# Patient Record
Sex: Male | Born: 1980 | Race: White | Hispanic: No | Marital: Single | Smoking: Former smoker
Health system: Southern US, Community
[De-identification: ages and names within clinical notes are randomized; demographics above are authoritative.]

## PROBLEM LIST (undated history)

## (undated) DIAGNOSIS — A498 Other bacterial infections of unspecified site: Secondary | ICD-10-CM

## (undated) DIAGNOSIS — K259 Gastric ulcer, unspecified as acute or chronic, without hemorrhage or perforation: Secondary | ICD-10-CM

## (undated) DIAGNOSIS — I10 Essential (primary) hypertension: Secondary | ICD-10-CM

## (undated) DIAGNOSIS — R918 Other nonspecific abnormal finding of lung field: Secondary | ICD-10-CM

## (undated) DIAGNOSIS — E279 Disorder of adrenal gland, unspecified: Secondary | ICD-10-CM

## (undated) DIAGNOSIS — I82419 Acute embolism and thrombosis of unspecified femoral vein: Secondary | ICD-10-CM

## (undated) DIAGNOSIS — K449 Diaphragmatic hernia without obstruction or gangrene: Secondary | ICD-10-CM

## (undated) HISTORY — PX: APPENDECTOMY: SHX54

## (undated) HISTORY — PX: TONSILLECTOMY: SUR1361

## (undated) HISTORY — PX: NASAL SEPTUM SURGERY: SHX37

---

## 2016-03-12 DIAGNOSIS — A498 Other bacterial infections of unspecified site: Secondary | ICD-10-CM

## 2016-03-12 HISTORY — DX: Other bacterial infections of unspecified site: A49.8

## 2016-03-24 ENCOUNTER — Emergency Department (HOSPITAL_BASED_OUTPATIENT_CLINIC_OR_DEPARTMENT_OTHER): Payer: BLUE CROSS/BLUE SHIELD

## 2016-03-24 ENCOUNTER — Emergency Department (HOSPITAL_BASED_OUTPATIENT_CLINIC_OR_DEPARTMENT_OTHER)
Admission: EM | Admit: 2016-03-24 | Discharge: 2016-03-24 | Disposition: A | Discharge status: Home / Self Care | Payer: BLUE CROSS/BLUE SHIELD | Attending: Emergency Medicine | Admitting: Emergency Medicine

## 2016-03-24 ENCOUNTER — Encounter (HOSPITAL_BASED_OUTPATIENT_CLINIC_OR_DEPARTMENT_OTHER): Payer: Self-pay | Admitting: *Deleted

## 2016-03-24 DIAGNOSIS — M79604 Pain in right leg: Secondary | ICD-10-CM | POA: Diagnosis present

## 2016-03-24 DIAGNOSIS — M5416 Radiculopathy, lumbar region: Secondary | ICD-10-CM | POA: Diagnosis not present

## 2016-03-24 DIAGNOSIS — Z87891 Personal history of nicotine dependence: Secondary | ICD-10-CM | POA: Insufficient documentation

## 2016-03-24 DIAGNOSIS — K4090 Unilateral inguinal hernia, without obstruction or gangrene, not specified as recurrent: Secondary | ICD-10-CM

## 2016-03-24 DIAGNOSIS — Z79899 Other long term (current) drug therapy: Secondary | ICD-10-CM | POA: Insufficient documentation

## 2016-03-24 DIAGNOSIS — I10 Essential (primary) hypertension: Secondary | ICD-10-CM | POA: Diagnosis not present

## 2016-03-24 HISTORY — DX: Gastric ulcer, unspecified as acute or chronic, without hemorrhage or perforation: K25.9

## 2016-03-24 HISTORY — DX: Essential (primary) hypertension: I10

## 2016-03-24 HISTORY — DX: Other bacterial infections of unspecified site: A49.8

## 2016-03-24 HISTORY — DX: Other nonspecific abnormal finding of lung field: R91.8

## 2016-03-24 HISTORY — DX: Diaphragmatic hernia without obstruction or gangrene: K44.9

## 2016-03-24 HISTORY — DX: Disorder of adrenal gland, unspecified: E27.9

## 2016-03-24 HISTORY — DX: Acute embolism and thrombosis of unspecified femoral vein: I82.419

## 2016-03-24 MED ORDER — PREDNISONE 10 MG (21) PO TBPK: 10.0000 mg | ORAL_TABLET | Freq: Every day | ORAL | 0 refills | Status: DC

## 2016-03-24 MED ORDER — DEXAMETHASONE SODIUM PHOSPHATE 10 MG/ML IJ SOLN
10.0000 mg | Freq: Once | INTRAMUSCULAR | Status: AC
  Administered 2016-03-24: 10 mg via INTRAMUSCULAR
  Filled 2016-03-24: qty 1

## 2016-03-24 NOTE — ED Triage Notes (Addendum)
Patient states he has pain and swelling in his right groin for the last two days, after working on his feet for approximately 20 hours.  States the swelling has resolved, but the pain continues.  States he developed right leg cramps last night and has some swelling behind his right knee.  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  Numbness and tingling started this morning .

## 2016-03-24 NOTE — ED Provider Notes (Signed)
[facility] [facility] Provider Note   CSN: [id number] Arrival date & time: 03/24/16  1216  First Provider Contact:  None       History   Chief Complaint Chief Complaint  Patient presents with  . Leg Pain    HPI Benjamin Blackburn is a 35 y.o. male.  Pt has a hx of DVT and said that he has pain in his right leg c/w prior DVT pain.  Pt said that he was on lovenox and coumadin a few years ago for a DVT, then was put on just ASA.  Pt said that he was taken off ASA due to gastric ulcers.  Pt said that many people in his family have DVTs, but he has never been told that he has a "blood clotting disorder."  Pt denies cp or sob.      Past Medical History:  Diagnosis Date  . Adrenal abnormality (HCC)    mass on adrenal gland  . 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 E coli infection 03/2016   completed treatment  . Hiatal hernia   . Hypertension   . Lung mass   . Multiple gastric ulcers     There are no active problems to display for this patient.   Past Surgical History:  Procedure Laterality Date  . APPENDECTOMY    . NASAL SEPTUM SURGERY    . TONSILLECTOMY         Home Medications    Prior to Admission medications   Medication Sig Start Date End Date Taking? Authorizing Provider  BuPROPion HCl (WELLBUTRIN PO) Take by mouth.   Yes Historical Provider, MD  fenofibrate 54 MG tablet Take 54 mg by mouth daily.   Yes Historical Provider, MD  lisinopril-hydrochlorothiazide (PRINZIDE,ZESTORETIC) 10-12.5 MG tablet Take 1 tablet by mouth daily.   Yes Historical Provider, MD  predniSONE (STERAPRED UNI-PAK 21 TAB) 10 MG (21) TBPK tablet Take 1 tablet (10 mg total) by mouth daily. Take 6 tabs by mouth daily  for 2 days, then 5 tabs for 2 days, then 4 tabs for 2 days, then 3 tabs for 2 days, 2 tabs for 2 days, then 1 tab by mouth daily for 2 days 03/24/16   Isla Pence, MD    Family History No family history on file.  Social History Social History  Substance Use  Topics  . Smoking status: Former Research scientist (life sciences)  . Smokeless tobacco: Never Used  . Alcohol use No     Allergies   Amoxicillin; Penicillins; and Tetracyclines & related   Review of Systems Review of Systems  Respiratory: Negative for shortness of breath.   Cardiovascular: Negative for chest pain.  Musculoskeletal:       Right leg pain  All other systems reviewed and are negative.    Physical Exam Updated Vital Signs BP 157/97 (BP Location: Left Arm)   Pulse 82   Temp 97.7 F (36.5 C) (Oral)   Resp 14   Ht 6\' 2"  (1.88 m)   Wt 266 lb (120.7 kg)   SpO2 100%   BMI 34.15 kg/m   Physical Exam  Constitutional: He is oriented to person, place, and time. He appears well-developed and well-nourished.  HENT:  Head: Normocephalic and atraumatic.  Right Ear: External ear normal.  Left Ear: External ear normal.  Nose: Nose normal.  Mouth/Throat: Oropharynx is clear and moist.  Eyes: EOM are normal. Pupils are equal, round, and reactive to light.  Neck: Normal range of  motion. Neck supple.  Cardiovascular: Normal rate, regular rhythm, normal heart sounds and intact distal pulses.   Pulmonary/Chest: Effort normal and breath sounds normal.  Abdominal: Soft. Bowel sounds are normal.  Genitourinary:     Musculoskeletal:  Right leg swelling and tenderness  Neurological: He is alert and oriented to person, place, and time.  Skin: Skin is warm and dry.  Psychiatric: He has a normal mood and affect. His behavior is normal. Judgment and thought content normal.  Nursing note and vitals reviewed.    ED Treatments / Results  Labs (all labs ordered are listed, but only abnormal results are displayed) Labs Reviewed - No data to display  EKG  EKG Interpretation None       Radiology US Venous Img Lower Unilateral Right  Result Date: 03/24/2016 CLINICAL DATA:  RIGHT groin pain radiating to posterior calf for 2 days, intermittent swelling per patient, history of DVT RIGHT leg 2  years ago, known venous insufficiency with venous stasis at RIGHT ankle EXAM: RIGHT LOWER EXTREMITY VENOUS DOPPLER ULTRASOUND TECHNIQUE: Gray-scale sonography with graded compression, as well as color Doppler and duplex ultrasound were performed to evaluate the lower extremity deep venous systems from the level of the common femoral vein and including the common femoral, femoral, profunda femoral, popliteal and calf veins including the posterior tibial, peroneal and gastrocnemius veins when visible. The superficial great saphenous vein was also interrogated. Spectral Doppler was utilized to evaluate flow at rest and with distal augmentation maneuvers in the common femoral, femoral and popliteal veins. COMPARISON:  None. FINDINGS: Contralateral Common Femoral Vein: Respiratory phasicity is normal and symmetric with the symptomatic side. No evidence of thrombus. Normal compressibility. Common Femoral Vein: No evidence of thrombus. Normal compressibility, respiratory phasicity and response to augmentation. Saphenofemoral Junction: No evidence of thrombus. Normal compressibility and flow on color Doppler imaging. Profunda Femoral Vein: No evidence of thrombus. Normal compressibility and flow on color Doppler imaging. Femoral Vein: No evidence of thrombus. Normal compressibility, respiratory phasicity and response to augmentation. Popliteal Vein: No evidence of thrombus. Normal compressibility, respiratory phasicity and response to augmentation. Calf Veins: No evidence of thrombus. Normal compressibility and flow on color Doppler imaging. Superficial Great Saphenous Vein: No evidence of thrombus. Normal compressibility and flow on color Doppler imaging. Venous Reflux:  None. Other Findings:  None. IMPRESSION: No evidence of deep venous thrombosis in RIGHT lower extremity. Electronically Signed   By: Lavonia Dana M.D.   On: 03/24/2016 13:43    Procedures Procedures (including critical care time)  Medications Ordered  in ED Medications  dexamethasone (DECADRON) injection 10 mg (not administered)     Initial Impression / Assessment and Plan / ED Course  I have reviewed the triage vital signs and the nursing notes.  Pertinent labs & imaging results that were available during my care of the patient were reviewed by me and considered in my medical decision making (see chart for details).  Clinical Course   Pt describes pain going down his right leg.  As his [place] is negative for dvt, I will treat pt for radiculopathy.  He is given the number to ortho (for radicular pain) and to surgery (for hernia).  He knows to return if worse.   Final Clinical Impressions(s) / ED Diagnoses   Final diagnoses:  Lumbar radiculopathy, acute  Unilateral inguinal hernia without obstruction or gangrene, recurrence not specified    New Prescriptions New Prescriptions   PREDNISONE (STERAPRED UNI-PAK 21 TAB) 10 MG (21) TBPK TABLET  Take 1 tablet (10 mg total) by mouth daily. Take 6 tabs by mouth daily  for 2 days, then 5 tabs for 2 days, then 4 tabs for 2 days, then 3 tabs for 2 days, 2 tabs for 2 days, then 1 tab by mouth daily for 2 days     Isla Pence, MD 03/24/16 1409

## 2016-04-01 ENCOUNTER — Ambulatory Visit: Payer: BLUE CROSS/BLUE SHIELD | Admitting: Family Medicine

## 2016-05-02 ENCOUNTER — Emergency Department (HOSPITAL_BASED_OUTPATIENT_CLINIC_OR_DEPARTMENT_OTHER)
Admission: EM | Admit: 2016-05-02 | Discharge: 2016-05-02 | Disposition: A | Discharge status: Home / Self Care | Payer: BLUE CROSS/BLUE SHIELD | Attending: Emergency Medicine | Admitting: Emergency Medicine

## 2016-05-02 ENCOUNTER — Encounter (HOSPITAL_BASED_OUTPATIENT_CLINIC_OR_DEPARTMENT_OTHER): Payer: Self-pay | Admitting: Emergency Medicine

## 2016-05-02 DIAGNOSIS — R197 Diarrhea, unspecified: Secondary | ICD-10-CM | POA: Insufficient documentation

## 2016-05-02 DIAGNOSIS — I10 Essential (primary) hypertension: Secondary | ICD-10-CM | POA: Diagnosis not present

## 2016-05-02 DIAGNOSIS — J029 Acute pharyngitis, unspecified: Secondary | ICD-10-CM | POA: Diagnosis not present

## 2016-05-02 DIAGNOSIS — Z87891 Personal history of nicotine dependence: Secondary | ICD-10-CM | POA: Diagnosis not present

## 2016-05-02 DIAGNOSIS — L03211 Cellulitis of face: Secondary | ICD-10-CM | POA: Diagnosis not present

## 2016-05-02 DIAGNOSIS — R52 Pain, unspecified: Secondary | ICD-10-CM | POA: Diagnosis present

## 2016-05-02 DIAGNOSIS — R5383 Other fatigue: Secondary | ICD-10-CM | POA: Diagnosis not present

## 2016-05-02 DIAGNOSIS — J340 Abscess, furuncle and carbuncle of nose: Secondary | ICD-10-CM

## 2016-05-02 DIAGNOSIS — Z79899 Other long term (current) drug therapy: Secondary | ICD-10-CM | POA: Diagnosis not present

## 2016-05-02 LAB — COMPREHENSIVE METABOLIC PANEL
ALT: 66 U/L — AB (ref 17–63)
AST: 32 U/L (ref 15–41)
Albumin: 4.3 g/dL (ref 3.5–5.0)
Alkaline Phosphatase: 52 U/L (ref 38–126)
Anion gap: 6 (ref 5–15)
BILIRUBIN TOTAL: 0.5 mg/dL (ref 0.3–1.2)
BUN: 12 mg/dL (ref 6–20)
CALCIUM: 9.1 mg/dL (ref 8.9–10.3)
CHLORIDE: 105 mmol/L (ref 101–111)
CO2: 25 mmol/L (ref 22–32)
CREATININE: 1 mg/dL (ref 0.61–1.24)
Glucose, Bld: 91 mg/dL (ref 65–99)
Potassium: 4 mmol/L (ref 3.5–5.1)
Sodium: 136 mmol/L (ref 135–145)
TOTAL PROTEIN: 7.2 g/dL (ref 6.5–8.1)

## 2016-05-02 LAB — CBC WITH DIFFERENTIAL/PLATELET
BASOS ABS: 0 10*3/uL (ref 0.0–0.1)
BASOS PCT: 0 %
EOS ABS: 0.1 10*3/uL (ref 0.0–0.7)
EOS PCT: 1 %
HCT: 42.9 % (ref 39.0–52.0)
HEMOGLOBIN: 15.2 g/dL (ref 13.0–17.0)
LYMPHS ABS: 2.5 10*3/uL (ref 0.7–4.0)
Lymphocytes Relative: 27 %
MCH: 30.4 pg (ref 26.0–34.0)
MCHC: 35.4 g/dL (ref 30.0–36.0)
MCV: 85.8 fL (ref 78.0–100.0)
Monocytes Absolute: 0.7 10*3/uL (ref 0.1–1.0)
Monocytes Relative: 8 %
NEUTROS PCT: 64 %
Neutro Abs: 5.9 10*3/uL (ref 1.7–7.7)
PLATELETS: 243 10*3/uL (ref 150–400)
RBC: 5 MIL/uL (ref 4.22–5.81)
RDW: 12.3 % (ref 11.5–15.5)
WBC: 9.4 10*3/uL (ref 4.0–10.5)

## 2016-05-02 LAB — URINALYSIS, ROUTINE W REFLEX MICROSCOPIC
Bilirubin Urine: NEGATIVE
GLUCOSE, UA: NEGATIVE mg/dL
HGB URINE DIPSTICK: NEGATIVE
Ketones, ur: NEGATIVE mg/dL
LEUKOCYTES UA: NEGATIVE
Nitrite: NEGATIVE
PROTEIN: NEGATIVE mg/dL
SPECIFIC GRAVITY, URINE: 1.018 (ref 1.005–1.030)
pH: 7.5 (ref 5.0–8.0)

## 2016-05-02 MED ORDER — SODIUM CHLORIDE 0.9 % IV BOLUS (SEPSIS)
1000.0000 mL | Freq: Once | INTRAVENOUS | Status: AC
  Administered 2016-05-02: 1000 mL via INTRAVENOUS

## 2016-05-02 MED ORDER — SULFAMETHOXAZOLE-TRIMETHOPRIM 800-160 MG PO TABS: 1.0000 | ORAL_TABLET | Freq: Two times a day (BID) | ORAL | 0 refills | Status: DC

## 2016-05-02 NOTE — ED Provider Notes (Signed)
[place] [facility] Provider Note   CSN: [id number] Arrival date & time: 05/02/16  1714  By signing my name below, I, Emmanuella Mensah, attest that this documentation has been prepared under the direction and in the presence of Davonna Belling, MD. Electronically Signed: Judithann Sauger, ED Scribe. 05/02/16. 6:17 PM.    History   Chief Complaint Chief Complaint  Patient presents with  . Generalized Body Aches    HPI Comments: Benjamin Blackburn is a 35 y.o. male with a hx of DVT of right deep femoral vein, E. Coli infection, and hypertension who presents to the Emergency Department complaining of gradually worsening multiple moderately painful flat areas of redness to his RLE onset several weeks ago. He notes that the areas of redness began to spread to his right nostril and right axilla region within the last few days. He reports associated chills, generalized body aches, mild sore throat, and RLE numbness. No alleviating factors noted. Pt has not tried any medications PTA. He reports that he was diagnosed with E. Coli from a stool sample 4 weeks ago and has been treated with Cipro. He states that the Cipro has not been helpful with his diarrheal symptoms. He adds that he has been having persistent moderate episodes of non-bloody, dark watery diarrhea onset one year ago. He denies any recent travel outside the country. He also denies any fever, unexpected weight changes, cough, shortness of breath, headache, dizziness, lightheadedness, trouble swallowing, or any lip/tongue swelling.   The history is provided by the patient. No language interpreter was used.    Past Medical History:  Diagnosis Date  . Adrenal abnormality (HCC)    mass on adrenal gland  . 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 E coli infection 03/2016   completed treatment  . Hiatal hernia   . Hypertension   . Lung mass   . Multiple gastric ulcers     There are no active problems to display for this  patient.   Past Surgical History:  Procedure Laterality Date  . APPENDECTOMY    . NASAL SEPTUM SURGERY    . TONSILLECTOMY         Home Medications    Prior to Admission medications   Medication Sig Start Date End Date Taking? Authorizing Provider  BuPROPion HCl (WELLBUTRIN PO) Take by mouth.    Historical Provider, MD  fenofibrate 54 MG tablet Take 54 mg by mouth daily.    Historical Provider, MD  lisinopril-hydrochlorothiazide (PRINZIDE,ZESTORETIC) 10-12.5 MG tablet Take 1 tablet by mouth daily.    Historical Provider, MD  predniSONE (STERAPRED UNI-PAK 21 TAB) 10 MG (21) TBPK tablet Take 1 tablet (10 mg total) by mouth daily. Take 6 tabs by mouth daily  for 2 days, then 5 tabs for 2 days, then 4 tabs for 2 days, then 3 tabs for 2 days, 2 tabs for 2 days, then 1 tab by mouth daily for 2 days 03/24/16   Isla Pence, MD  sulfamethoxazole-trimethoprim (BACTRIM DS,SEPTRA DS) 800-160 MG tablet Take 1 tablet by mouth 2 (two) times daily. 05/02/16 05/09/16  Davonna Belling, MD    Family History History reviewed. No pertinent family history.  Social History Social History  Substance Use Topics  . Smoking status: Former Research scientist (life sciences)  . Smokeless tobacco: Never Used  . Alcohol use No     Allergies   Amoxicillin; Penicillins; and Tetracyclines & related   Review of Systems Review of Systems  Constitutional: Positive for chills and  fatigue. Negative for fever and unexpected weight change.  HENT: Positive for sore throat. Negative for facial swelling and trouble swallowing.   Respiratory: Negative for cough and shortness of breath.   Skin: Positive for rash.  Neurological: Positive for numbness. Negative for dizziness, light-headedness and headaches.  All other systems reviewed and are negative.    Physical Exam Updated Vital Signs BP 117/65 (BP Location: Right Arm)   Pulse 60   Temp 97.6 F (36.4 C) (Oral)   Resp 16   Ht 6\' 2"  (1.88 m)   Wt 264 lb (119.7 kg)   SpO2 97%    BMI 33.90 kg/m   Physical Exam  Constitutional: He is oriented to person, place, and time. He appears well-developed and well-nourished. No distress.  HENT:  Head: Normocephalic and atraumatic.  Tenderness to inside of right lateral nostril where there is a 3 cm pustule on the outside of it. no drainge, no swelling of face  Eyes: Conjunctivae and EOM are normal.  Neck: Neck supple. No tracheal deviation present.  Cardiovascular: Normal rate, regular rhythm and normal heart sounds.   Pulmonary/Chest: Effort normal. No respiratory distress. He has no wheezes.  Abdominal: Soft. Bowel sounds are normal. There is no tenderness.  Musculoskeletal: Normal range of motion. He exhibits edema.  Trace BLE pitting edema  Neurological: He is alert and oriented to person, place, and time.  Skin: Skin is warm and dry.  Few small 3 cm areas of raised erythematous bumps on trunks and extremities, none on palms or nailbeds.   Psychiatric: He has a normal mood and affect. His behavior is normal.  Nursing note and vitals reviewed.    ED Treatments / Results  DIAGNOSTIC STUDIES: Oxygen Saturation is 98% on RA, normal by my interpretation.    COORDINATION OF CARE: 6:13 PM- Pt advised of plan for treatment and pt agrees. He will receive lab work for further evaluation. He will also receive IV fluids.     Labs (all labs ordered are listed, but only abnormal results are displayed) Labs Reviewed  COMPREHENSIVE METABOLIC PANEL - Abnormal; Notable for the following:       Result Value   ALT 66 (*)    All other components within normal limits  CULTURE, BLOOD (ROUTINE X 2)  CULTURE, BLOOD (ROUTINE X 2)  CBC WITH DIFFERENTIAL/PLATELET  URINALYSIS, ROUTINE W REFLEX MICROSCOPIC (NOT AT [facility])    EKG  EKG Interpretation None       Radiology No results found.  Procedures Procedures (including critical care time)  Medications Ordered in ED Medications  sodium chloride 0.9 % bolus 1,000 mL (1,000  mLs Intravenous New Bag/Given 05/02/16 1858)     Initial Impression / Assessment and Plan / ED Course  Davonna Belling, MD has reviewed the triage vital signs and the nursing notes.  Pertinent labs & imaging results that were available during my care of the patient were reviewed by me and considered in my medical decision making (see chart for details).  Clinical Course    Patient presents with generalized weakness. Has a rash. Sore throat. Has had chills. Reportedly had Escherichia coli in stool. Had been on Cipro. Does have small abscess in his nose with some drainage. Labs reassuring. Feels somewhat better after IV fluids. Blood cultures sent since he had reported Escherichia coli infection and has been feeling bad systemically with a rash. Labs reassuring and patient will be discharged home with antibiotics to cover the cellulitis surrounding the abscess.  Final Clinical  Impressions(s) / ED Diagnoses   Final diagnoses:  Cellulitis of nose  Diarrhea, unspecified type    New Prescriptions New Prescriptions   SULFAMETHOXAZOLE-TRIMETHOPRIM (BACTRIM DS,SEPTRA DS) 800-160 MG TABLET    Take 1 tablet by mouth 2 (two) times daily.   I personally performed the services described in this documentation, which was scribed in my presence. The recorded information has been reviewed and is accurate.       Davonna Belling, MD 05/02/16 2033

## 2016-05-02 NOTE — ED Triage Notes (Signed)
Patient states that he was dx with E coli about 2 weeks ago and did a round of cipro. He has a "spot" and "bumps" to his nose and axilla region. He reports that he just in general "bad"

## 2016-05-07 LAB — CULTURE, BLOOD (ROUTINE X 2)
CULTURE: NO GROWTH
Culture: NO GROWTH

## 2016-05-08 ENCOUNTER — Emergency Department (HOSPITAL_COMMUNITY): Payer: BLUE CROSS/BLUE SHIELD

## 2016-05-08 ENCOUNTER — Observation Stay (HOSPITAL_COMMUNITY)
Admission: EM | Admit: 2016-05-08 | Discharge: 2016-05-10 | Disposition: A | Discharge status: Home / Self Care | Payer: BLUE CROSS/BLUE SHIELD | Attending: Family Medicine | Admitting: Family Medicine

## 2016-05-08 ENCOUNTER — Encounter (HOSPITAL_COMMUNITY): Payer: Self-pay

## 2016-05-08 DIAGNOSIS — Z86718 Personal history of other venous thrombosis and embolism: Secondary | ICD-10-CM | POA: Insufficient documentation

## 2016-05-08 DIAGNOSIS — E278 Other specified disorders of adrenal gland: Secondary | ICD-10-CM | POA: Insufficient documentation

## 2016-05-08 DIAGNOSIS — R1013 Epigastric pain: Secondary | ICD-10-CM | POA: Diagnosis not present

## 2016-05-08 DIAGNOSIS — E785 Hyperlipidemia, unspecified: Secondary | ICD-10-CM | POA: Diagnosis not present

## 2016-05-08 DIAGNOSIS — Z87891 Personal history of nicotine dependence: Secondary | ICD-10-CM | POA: Diagnosis not present

## 2016-05-08 DIAGNOSIS — Z881 Allergy status to other antibiotic agents status: Secondary | ICD-10-CM | POA: Diagnosis not present

## 2016-05-08 DIAGNOSIS — Z6834 Body mass index (BMI) 34.0-34.9, adult: Secondary | ICD-10-CM | POA: Insufficient documentation

## 2016-05-08 DIAGNOSIS — R51 Headache: Secondary | ICD-10-CM | POA: Diagnosis not present

## 2016-05-08 DIAGNOSIS — K449 Diaphragmatic hernia without obstruction or gangrene: Secondary | ICD-10-CM | POA: Insufficient documentation

## 2016-05-08 DIAGNOSIS — F329 Major depressive disorder, single episode, unspecified: Secondary | ICD-10-CM | POA: Insufficient documentation

## 2016-05-08 DIAGNOSIS — I2510 Atherosclerotic heart disease of native coronary artery without angina pectoris: Secondary | ICD-10-CM | POA: Diagnosis not present

## 2016-05-08 DIAGNOSIS — K760 Fatty (change of) liver, not elsewhere classified: Secondary | ICD-10-CM | POA: Insufficient documentation

## 2016-05-08 DIAGNOSIS — G2581 Restless legs syndrome: Secondary | ICD-10-CM | POA: Insufficient documentation

## 2016-05-08 DIAGNOSIS — Z9119 Patient's noncompliance with other medical treatment and regimen: Secondary | ICD-10-CM | POA: Insufficient documentation

## 2016-05-08 DIAGNOSIS — R079 Chest pain, unspecified: Principal | ICD-10-CM | POA: Insufficient documentation

## 2016-05-08 DIAGNOSIS — J340 Abscess, furuncle and carbuncle of nose: Secondary | ICD-10-CM | POA: Insufficient documentation

## 2016-05-08 DIAGNOSIS — M7989 Other specified soft tissue disorders: Secondary | ICD-10-CM

## 2016-05-08 DIAGNOSIS — G4733 Obstructive sleep apnea (adult) (pediatric): Secondary | ICD-10-CM | POA: Insufficient documentation

## 2016-05-08 DIAGNOSIS — Z880 Allergy status to penicillin: Secondary | ICD-10-CM | POA: Insufficient documentation

## 2016-05-08 DIAGNOSIS — Z8249 Family history of ischemic heart disease and other diseases of the circulatory system: Secondary | ICD-10-CM | POA: Insufficient documentation

## 2016-05-08 DIAGNOSIS — R5382 Chronic fatigue, unspecified: Secondary | ICD-10-CM | POA: Insufficient documentation

## 2016-05-08 DIAGNOSIS — I10 Essential (primary) hypertension: Secondary | ICD-10-CM | POA: Diagnosis not present

## 2016-05-08 DIAGNOSIS — E669 Obesity, unspecified: Secondary | ICD-10-CM | POA: Insufficient documentation

## 2016-05-08 DIAGNOSIS — K2980 Duodenitis without bleeding: Secondary | ICD-10-CM | POA: Insufficient documentation

## 2016-05-08 DIAGNOSIS — Z9049 Acquired absence of other specified parts of digestive tract: Secondary | ICD-10-CM | POA: Insufficient documentation

## 2016-05-08 DIAGNOSIS — G8929 Other chronic pain: Secondary | ICD-10-CM | POA: Insufficient documentation

## 2016-05-08 DIAGNOSIS — Z91048 Other nonmedicinal substance allergy status: Secondary | ICD-10-CM | POA: Insufficient documentation

## 2016-05-08 DIAGNOSIS — D3500 Benign neoplasm of unspecified adrenal gland: Secondary | ICD-10-CM | POA: Insufficient documentation

## 2016-05-08 DIAGNOSIS — Z8711 Personal history of peptic ulcer disease: Secondary | ICD-10-CM | POA: Insufficient documentation

## 2016-05-08 DIAGNOSIS — Z91040 Latex allergy status: Secondary | ICD-10-CM | POA: Diagnosis not present

## 2016-05-08 DIAGNOSIS — F419 Anxiety disorder, unspecified: Secondary | ICD-10-CM | POA: Insufficient documentation

## 2016-05-08 LAB — I-STAT TROPONIN, ED
TROPONIN I, POC: 0.01 ng/mL (ref 0.00–0.08)
Troponin i, poc: 0 ng/mL (ref 0.00–0.08)

## 2016-05-08 LAB — CBC
HEMATOCRIT: 49.8 % (ref 39.0–52.0)
HEMATOCRIT: 45.3 % (ref 39.0–52.0)
HEMOGLOBIN: 16.7 g/dL (ref 13.0–17.0)
Hemoglobin: 15.7 g/dL (ref 13.0–17.0)
MCH: 29.9 pg (ref 26.0–34.0)
MCH: 30.3 pg (ref 26.0–34.0)
MCHC: 33.5 g/dL (ref 30.0–36.0)
MCHC: 34.7 g/dL (ref 30.0–36.0)
MCV: 89.1 fL (ref 78.0–100.0)
MCV: 87.5 fL (ref 78.0–100.0)
Platelets: 243 10*3/uL (ref 150–400)
Platelets: 229 10*3/uL (ref 150–400)
RBC: 5.59 MIL/uL (ref 4.22–5.81)
RBC: 5.18 MIL/uL (ref 4.22–5.81)
RDW: 12.3 % (ref 11.5–15.5)
RDW: 12.3 % (ref 11.5–15.5)
WBC: 9.2 10*3/uL (ref 4.0–10.5)
WBC: 7.9 10*3/uL (ref 4.0–10.5)

## 2016-05-08 LAB — BASIC METABOLIC PANEL
ANION GAP: 11 (ref 5–15)
BUN: 11 mg/dL (ref 6–20)
CHLORIDE: 104 mmol/L (ref 101–111)
CO2: 22 mmol/L (ref 22–32)
Calcium: 9.6 mg/dL (ref 8.9–10.3)
Creatinine, Ser: 1.15 mg/dL (ref 0.61–1.24)
Glucose, Bld: 122 mg/dL — ABNORMAL HIGH (ref 65–99)
POTASSIUM: 4.2 mmol/L (ref 3.5–5.1)
SODIUM: 137 mmol/L (ref 135–145)

## 2016-05-08 LAB — TROPONIN I

## 2016-05-08 LAB — CREATININE, SERUM
CREATININE: 1.03 mg/dL (ref 0.61–1.24)
GFR calc Af Amer: >60 mL/min (ref >60)

## 2016-05-08 MED ORDER — HYDROCHLOROTHIAZIDE 12.5 MG PO CAPS
12.5000 mg | ORAL_CAPSULE | Freq: Every day | ORAL | Status: DC
  Administered 2016-05-09: 12.5 mg via ORAL
  Filled 2016-05-08: qty 1

## 2016-05-08 MED ORDER — PANTOPRAZOLE SODIUM 40 MG PO TBEC
40.0000 mg | DELAYED_RELEASE_TABLET | Freq: Every day | ORAL | Status: DC
  Administered 2016-05-08 – 2016-05-10 (×3): 40 mg via ORAL
  Filled 2016-05-08 (×3): qty 1

## 2016-05-08 MED ORDER — LISINOPRIL-HYDROCHLOROTHIAZIDE 10-12.5 MG PO TABS: 1.0000 | ORAL_TABLET | Freq: Every day | ORAL | Status: DC

## 2016-05-08 MED ORDER — SULFAMETHOXAZOLE-TRIMETHOPRIM 800-160 MG PO TABS
1.0000 | ORAL_TABLET | Freq: Two times a day (BID) | ORAL | Status: AC
  Administered 2016-05-08 – 2016-05-09 (×3): 1 via ORAL
  Filled 2016-05-08 (×3): qty 1

## 2016-05-08 MED ORDER — ENOXAPARIN SODIUM 40 MG/0.4ML ~~LOC~~ SOLN
40.0000 mg | SUBCUTANEOUS | Status: DC
  Administered 2016-05-08 – 2016-05-09 (×2): 40 mg via SUBCUTANEOUS
  Filled 2016-05-08 (×2): qty 0.4

## 2016-05-08 MED ORDER — ROPINIROLE HCL 1 MG PO TABS
1.0000 mg | ORAL_TABLET | Freq: Every day | ORAL | Status: DC
  Administered 2016-05-08 – 2016-05-09 (×2): 1 mg via ORAL
  Filled 2016-05-08 (×2): qty 1

## 2016-05-08 MED ORDER — ONDANSETRON HCL 4 MG/2ML IJ SOLN
4.0000 mg | Freq: Four times a day (QID) | INTRAMUSCULAR | Status: DC | PRN
  Administered 2016-05-09: 4 mg via INTRAVENOUS
  Filled 2016-05-08: qty 2

## 2016-05-08 MED ORDER — LISINOPRIL 10 MG PO TABS
10.0000 mg | ORAL_TABLET | Freq: Every day | ORAL | Status: DC
  Administered 2016-05-09 – 2016-05-10 (×2): 10 mg via ORAL
  Filled 2016-05-08 (×2): qty 1

## 2016-05-08 MED ORDER — ACETAMINOPHEN 500 MG PO TABS
1000.0000 mg | ORAL_TABLET | Freq: Four times a day (QID) | ORAL | Status: DC | PRN
  Administered 2016-05-08: 1000 mg via ORAL
  Filled 2016-05-08: qty 2

## 2016-05-08 MED ORDER — ACETAMINOPHEN 325 MG PO TABS
650.0000 mg | ORAL_TABLET | ORAL | Status: DC | PRN
  Administered 2016-05-09 – 2016-05-10 (×2): 650 mg via ORAL
  Filled 2016-05-08 (×2): qty 2

## 2016-05-08 MED ORDER — IOPAMIDOL (ISOVUE-370) INJECTION 76%
INTRAVENOUS | Status: AC
  Administered 2016-05-08: 100 mL
  Filled 2016-05-08: qty 100

## 2016-05-08 MED ORDER — ASPIRIN 81 MG PO CHEW
324.0000 mg | CHEWABLE_TABLET | Freq: Once | ORAL | Status: AC
  Administered 2016-05-08: 324 mg via ORAL
  Filled 2016-05-08: qty 4

## 2016-05-08 MED ORDER — FENOFIBRATE 54 MG PO TABS
54.0000 mg | ORAL_TABLET | Freq: Every day | ORAL | Status: DC
  Administered 2016-05-09 – 2016-05-10 (×2): 54 mg via ORAL
  Filled 2016-05-08 (×2): qty 1

## 2016-05-08 MED ORDER — GI COCKTAIL ~~LOC~~: 30.0000 mL | Freq: Four times a day (QID) | ORAL | Status: DC | PRN

## 2016-05-08 MED ORDER — NITROGLYCERIN 0.4 MG SL SUBL
0.4000 mg | SUBLINGUAL_TABLET | SUBLINGUAL | Status: DC | PRN
  Administered 2016-05-08: 0.4 mg via SUBLINGUAL
  Filled 2016-05-08: qty 1

## 2016-05-08 NOTE — H&P (Signed)
[facility] Admission History and Physical Service Pager: [phone]  Patient name: Benjamin Blackburn Medical record number: [id number] Date of birth: 06/10/1981 Age: 35 y.o. Gender: male  Primary Care Provider: No PCP Per Patient Consultants: None Code Status:  FULL per discussion on admission  Chief Complaint: chest pain  Assessment and Plan: Benjamin Blackburn is a 35 y.o. male presenting with chest pain . 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   Chest pain: HEART score is 3 secondary to nonspecific repolarization on EKG and risk factors. His father had 3 MIs prior to the age of 93 and the patient reportedly has never had a cardiac work up. EKG without ST elevation or depression. Initial troponin negative.  Given h/o unprovoked DVTs, there was concerned for PE, however CTA was negative. All labwork unremarkable. CXR clear. Chest pain not reproducible on exam making MSK etiology less likely.  Also on the DDx is GI related (as pt has a h/o epigastric pain and is tender in the epigastric region) and anxiety given palpitations prior to the onset of pain.  - place in observation, attending Dr. Nori Riis - monitor on telemetry - Risk stratification labs  - trend troponins  - EKG in the AM  - notes Dr. Koleen Nimrod (his endocrinologist) did not want him on ASA previously) - given significant family h/o, would consider getting cardiology c/s in the AM to see if he would benefit from outpatient work up with something like a stress test.   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 Notes he was treated with Lovenox and Coumadin but has been off this treatment for some time. States he's seen a vascular doctor but denies any hypercoagulable work up. Noted to have tenderness and a slight bulging just inferior to the R popliteal fossa.  - Doppler of the RLE - if noted  to have another DVT, would suggest hypercoagulable work up in the future  HLD: currently on fenofibrate - after lipid panel, consider addition of statin per results and ASCVD score  HTN: on lisinopril/HCTZ 10/12.5 mg by mouth once daily. Blood pressure at goal. - continue home regimen.   H/o cellulitis of the nose: not noted on my exam. - continue 3 more doses of Bactrim  Restless leg syndrome:  -continue home Requip   Chronic fatigue: notes orthopnea as well. States he's had a sleep study in the past. Was not diagnosed with apnea but notes he has a CPAP to wear intermittently for fatigue which he doesn't wear? - will hold off on CPAP for now  Depression: was on Wellbutrin but states he self discontinued this as has been doing well. He doesn't feel like he needs this any more  Epigastric pain, chronic: sees GI at [facility].  EGD March 2017 showed: "Mild esophagitis, Irregular Z-line, Small hiatal hernia, Moderate erosive gastritis, Tiny shallow antral ulcer, Mild duodenitis" Biopsies showed inflammation in his stomach and esophagus but were otherwise negative for celiac disease, H. Pylori, or Barrett's esophagus.   Adrenal mass: ~2cm adrenal adenoma on the left.  Sees Dr. Koleen Nimrod with endocrinology at [facility]. Appears stable on CTA here from previous findings.   NAFLD/NASH: Noted during previous hospitalizations. Fatty infiltration of the liver noted on CTA today - will check LFTs in the AM  FEN/GI: heart healthy diet/PPi/ IV saline lock Prophylaxis: Lovenox   Disposition: observe overnight  History of Present Illness:  Benjamin Blackburn is a 35 y.o. male presenting with chest pain.   Today he suddenly started having palpitations that started around 12:45pm while on the elevator at work. He then immediately had centrally located chest pain that radiated up to his L shoulder. The pain was characterized as moderate to severe with "heavy" and tightness."   He had some tingling of his L  UE but he isn't sure if it was secondary to anxiety.  He felt lightheaded and dizzy with some diaphoresis. No SOB.  No vision change. No headaches. Endorses some nausea, but no emesis. The palpitations resolved very quickly but all his other symptoms persisted. He notes slightly worsened LE swelling in the RLE. He also endorses some orthopnea since being in the ED.   He has mild numbness in his R leg that previously had a DVT in it (4 years ago and 2 years ago).  He's never had any chest pain like this before. His baseline epgiastric pain is stable.   In the ED, he received ASA 324mg , Tylenol, and SL NGT.  After getting SL NGT at 1617 his other symptoms resolved.   Review Of Systems: Per HPI with the following additions:  Review of Systems  Constitutional: Positive for diaphoresis and malaise/fatigue. Negative for chills and fever.       Chronic fatigue  HENT: Negative for congestion.   Eyes: Negative for blurred vision, photophobia and discharge.  Respiratory: Positive for shortness of breath. Negative for cough, hemoptysis and sputum production.   Cardiovascular: Positive for chest pain, palpitations, orthopnea and leg swelling. Negative for claudication.  Gastrointestinal: Positive for abdominal pain, heartburn and nausea. Negative for constipation, diarrhea and vomiting.  Genitourinary: Negative for dysuria and flank pain.  Musculoskeletal: Negative for back pain, falls and myalgias.  Skin: Negative for rash.  Neurological: Positive for dizziness and tingling. Negative for tremors, speech change, focal weakness, loss of consciousness and headaches.  Psychiatric/Behavioral: Positive for depression.       Notes depression is stable and he no longer takes Wellbutrin    Past Medical History: Past Medical History:  Diagnosis Date  . Adrenal abnormality (HCC)    mass on adrenal gland  . 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 E coli infection 03/2016   completed treatment  . Hiatal  hernia   . Hypertension   . Lung mass   . Multiple gastric ulcers     Past Surgical History: Past Surgical History:  Procedure Laterality Date  . APPENDECTOMY    . NASAL SEPTUM SURGERY    . TONSILLECTOMY      Social History: Social History  Substance Use Topics  . Smoking status: Former Research scientist (life sciences)  . Smokeless tobacco: Never Used  . Alcohol use No   Additional social history: former smoker (quite within the last year), occasional alcohol use, no drug use.   Please also refer to relevant sections of EMR.  Family History: No family history on file. PVD in father and uncle  Dad- 3 MIs prior to the age of 91.   Allergies and Medications: Allergies  Allergen Reactions  . Amoxicillin Rash  . Latex Rash  . Penicillins Rash    Has patient had a PCN reaction causing immediate rash, facial/tongue/throat swelling, SOB or lightheadedness with hypotension: Yes Has patient had a PCN reaction causing severe rash involving mucus membranes or skin necrosis: No Has patient had a PCN reaction that required hospitalization No Has  patient had a PCN reaction occurring within the last 10 years: No If all of the above answers are "NO", then may proceed with Cephalosporin use.   . Tape Rash  . Tetracyclines & Related Rash   No current facility-administered medications on file prior to encounter.    Current Outpatient Prescriptions on File Prior to Encounter  Medication Sig Dispense Refill  . fenofibrate 54 MG tablet Take 54 mg by mouth daily.    Marland Kitchen sulfamethoxazole-trimethoprim (BACTRIM DS,SEPTRA DS) 800-160 MG tablet Take 1 tablet by mouth 2 (two) times daily. 10 tablet 0  . predniSONE (STERAPRED UNI-PAK 21 TAB) 10 MG (21) TBPK tablet Take 1 tablet (10 mg total) by mouth daily. Take 6 tabs by mouth daily  for 2 days, then 5 tabs for 2 days, then 4 tabs for 2 days, then 3 tabs for 2 days, 2 tabs for 2 days, then 1 tab by mouth daily for 2 days (Patient not taking: Reported on 05/08/2016) 42 tablet  0  lisinopril-HCTZ 10-12.5mg   Requip 1mg  qHS    Objective: BP 113/99   Pulse 66   Temp 97.5 F (36.4 C) (Oral)   Resp 15   SpO2 95%  Exam: General: Lying in bed in NAD. Non-toxic.  Eyes: Conjunctivae non-injected.  ENTM: Moist mucous membranes. Oropharynx clear. No nasal discharge.  Neck: Supple, no LAD. No JVD.  Cardiovascular: RRR. No murmurs, rubs, or gallops noted. Pain no reproducible on exam. Mild edema of the LE b/l, R>L but no pitting edema noted. Respiratory: No increased WOB. CTAB without wheezing, rhonchi, or crackles noted. Abdomen: +BS, soft, non-distended, minimally tender in the epigastric region. No rebound or guarding.  MSK: Normal bulk and tone. Some fullness inferior to the posterior fossa on the right with tenderness of the RLE. Positive Homans on the right.  Skin: No rashes noted over the exposed skin.   Neuro: A&O x4. No gross neurologic deficits  Psych:  Appropriate mood and affect.    Labs and Imaging: CBC BMET   Recent Labs Lab 05/08/16 1335  WBC 9.2  HGB 16.7  HCT 49.8  PLT 243    Recent Labs Lab 05/08/16 1335  NA 137  K 4.2  CL 104  CO2 22  BUN 11  CREATININE 1.15  GLUCOSE 122*  CALCIUM 9.6     Troponin 0.0, 0.01  Dg Chest 2 View  Result Date: 05/08/2016 CLINICAL DATA:  Chest pain and shortness of breath beginning today. EXAM: CHEST  2 VIEW COMPARISON:  CT chest and PA and lateral chest 10/27/2015. FINDINGS: The lungs are clear. Heart size is normal. No pneumothorax or pleural effusion. No focal bony abnormality. IMPRESSION: Negative chest. Electronically Signed   By: Inge Rise M.D.   On: 05/08/2016 14:10   Ct Angio Chest Pe W And/or Wo Contrast  Result Date: 05/08/2016 CLINICAL DATA:  PT WORKS HERE AND WAS GETTING ON ELEVATOR ABOUT 3 HRS AGO WHEN HE HAD A VERY SHARP PAIN IN MID, LEFT CHEST, WITH PALPATIONS AND EXCESSIVE SWEATINGHAD SOMETHING SIMILAR TO THIS ABOUT 3 MONTHS AGO AT [place] BUT WAS TOLD HE HAD A VERY LARGE  HIATAL HERNIA THAT WAS CAUSING THE PROBLEMNOT DIABETIC EXAM: CT ANGIOGRAPHY CHEST WITH CONTRAST TECHNIQUE: Multidetector CT imaging of the chest was performed using the standard protocol during bolus administration of intravenous contrast. Multiplanar CT image reconstructions and MIPs were obtained to evaluate the vascular anatomy. CONTRAST:  100 mL of Isovue 370 intravenous contrast COMPARISON:  Current chest radiograph.  Chest  CTA, 10/27/2015 FINDINGS: Cardiovascular: No evidence of a pulmonary embolus. The great vessels normal caliber. No aortic dissection. No atherosclerotic plaque along the aorta. Heart is normal in size and configuration. No coronary artery calcifications. Mediastinum/Nodes: No enlarged mediastinal, hilar, or axillary lymph nodes. Thyroid gland, trachea, and esophagus demonstrate no significant findings. Lungs/Pleura: Lungs are clear. No pleural effusion or pneumothorax. Upper Abdomen: Fatty infiltration of the liver. 17 mm low-density left adrenal mass consistent with an adenoma no acute findings. Musculoskeletal: No chest wall abnormality. No acute or significant osseous findings. Review of the MIP images confirms the above findings. IMPRESSION: 1. No evidence of a pulmonary embolus. 2. No acute findings. 3. No vascular abnormality.  No coronary artery calcifications. Electronically Signed   By: Lajean Manes M.D.   On: 05/08/2016 17:32     Archie Patten, MD 05/08/2016, 7:14 PM PGY-3, [place] [facility] pager: [phone], text pages welcome

## 2016-05-08 NOTE — ED Provider Notes (Signed)
[facility] DEPT Provider Note  CSN: [id number] Arrival Date & Time: 05/08/16 @ 53  History    Chief Complaint Chief Complaint  Patient presents with  . Chest Pain    HPI Benjamin Blackburn is a 35 y.o. male who presents To the emergency department for assessment of event of chest pain palpitations and near syncope at approximately 12:30 today. Patient was leaving elevator and had recently exerted himself and felt pressure and palpitations over the sternal region of chest. Patient endorses no shortness of breath. 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 Patient has significant family history of cardiac disease in father who had 3 heart attacks before the age of 47. Patient also has hyperlipidemia hypertension however does not have a history of pulmonary embolus. Patient endorses no other concerns at this time however endorses the chest pain is still present and is a dull ache 2 out of 10.  Patient doses no previous history of arrhythmias and does not take medications for his heart.  Past Medical & Surgical History    Past Medical History:  Diagnosis Date  . Adrenal abnormality (HCC)    mass on adrenal gland  . 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 E coli infection 03/2016   completed treatment  . Hiatal hernia   . Hypertension   . Lung mass   . Multiple gastric ulcers    There are no active problems to display for this patient.  Past Surgical History:  Procedure Laterality Date  . APPENDECTOMY    . NASAL SEPTUM SURGERY    . TONSILLECTOMY      Family & Social History    No family history on file. Social History  Substance Use Topics  . Smoking status: Former Research scientist (life sciences)  . Smokeless tobacco: Never Used  . Alcohol use No    Home Medications    Prior to Admission medications   Medication Sig Start Date End Date  Taking? Authorizing Provider  buPROPion (WELLBUTRIN SR) 150 MG 12 hr tablet Take 150 mg by mouth 2 (two) times daily as needed for smoking cessation. 10/19/15 10/18/16 Yes Historical Provider, MD  calcium carbonate (TUMS - DOSED IN MG ELEMENTAL CALCIUM) 500 MG chewable tablet Chew 1-2 tablets by mouth daily as needed for indigestion.   Yes Historical Provider, MD  fenofibrate 54 MG tablet Take 54 mg by mouth daily.   Yes Historical Provider, MD  lisinopril-hydrochlorothiazide (PRINZIDE,ZESTORETIC) 10-12.5 MG tablet Take 1 tablet by mouth daily. 10/19/15 10/18/16 Yes Historical Provider, MD  rOPINIRole (REQUIP) 1 MG tablet Take 1 mg by mouth at bedtime.   Yes Historical Provider, MD  sulfamethoxazole-trimethoprim (BACTRIM DS,SEPTRA DS) 800-160 MG tablet Take 1 tablet by mouth 2 (two) times daily. 05/02/16 05/09/16 Yes Davonna Belling, MD  predniSONE (STERAPRED UNI-PAK 21 TAB) 10 MG (21) TBPK tablet Take 1 tablet (10 mg total) by mouth daily. Take 6 tabs by mouth daily  for 2 days, then 5 tabs for 2 days, then 4 tabs for 2 days, then 3 tabs for 2 days, 2 tabs for 2 days, then 1 tab by mouth daily for 2 days Patient not taking: Reported on 05/08/2016 03/24/16   Isla Pence, MD    Allergies    Amoxicillin; Latex; Penicillins; Tape; and Tetracyclines & related  Review of Systems  Review of Systems  Respiratory: Negative for shortness  of breath.   Cardiovascular: Positive for chest pain, palpitations and leg swelling (and pain).     Physical Exam Updated Vital Signs BP (!) 163/111 (BP Location: Left Arm)   Pulse 69   Temp 97.5 F (36.4 C) (Oral)   Resp 18   SpO2 99%   Physical Exam  Constitutional: He is oriented to person, place, and time. He appears well-developed and well-nourished.  Non-toxic appearance. He does not appear ill. No distress.  HENT:  Head: Normocephalic and atraumatic.  Right Ear: External ear normal.  Left Ear: External ear normal.  Mouth/Throat: Oropharynx is clear and  moist.  Eyes: EOM are normal. Pupils are equal, round, and reactive to light. No scleral icterus.  Neck: Normal range of motion. Neck supple. No tracheal deviation present.  Cardiovascular: Normal heart sounds and intact distal pulses.   No murmur heard. Pulmonary/Chest: Effort normal and breath sounds normal. No stridor. No respiratory distress. He has no wheezes. He has no rales.  Abdominal: Soft. Bowel sounds are normal. He exhibits no distension. There is no tenderness. There is no rebound and no guarding.  Genitourinary:  Genitourinary Comments: Patient has no bilateral inguinal hernias however does have dilated "bag of warm" like swelling in the right testicle. Testicles lie vertically bilaterally and cremasteric reflexes intact bilaterally. No discharge or lesions of testicles or scrotum.  Musculoskeletal: Normal range of motion. He exhibits edema (of the RLE > LLE) and tenderness (over the posterior RLE). He exhibits no deformity.  Neurological: He is alert and oriented to person, place, and time. He has normal strength and normal reflexes. No cranial nerve deficit or sensory deficit.  Skin: Skin is warm and dry. Capillary refill takes less than 2 seconds.  Psychiatric: He has a normal mood and affect. His behavior is normal.  Nursing note and vitals reviewed.   Physical Exam  Constitutional: He is oriented to person, place, and time. He appears well-developed and well-nourished.  Non-toxic appearance. He does not appear ill. No distress.  HENT:  Head: Normocephalic and atraumatic.  Right Ear: External ear normal.  Left Ear: External ear normal.  Mouth/Throat: Oropharynx is clear and moist.  Eyes: EOM are normal. Pupils are equal, round, and reactive to light. No scleral icterus.  Neck: Normal range of motion. Neck supple. No tracheal deviation present.  Cardiovascular: Normal heart sounds and intact distal pulses.   No murmur heard. Pulmonary/Chest: Effort normal and breath sounds  normal. No stridor. No respiratory distress. He has no wheezes. He has no rales.  Abdominal: Soft. Bowel sounds are normal. He exhibits no distension. There is no tenderness. There is no rebound and no guarding.  Genitourinary:  Genitourinary Comments: Patient has no bilateral inguinal hernias however does have dilated "bag of warm" like swelling in the right testicle. Testicles lie vertically bilaterally and cremasteric reflexes intact bilaterally. No discharge or lesions of testicles or scrotum.  Musculoskeletal: Normal range of motion. He exhibits edema (of the RLE > LLE) and tenderness (over the posterior RLE). He exhibits no deformity.  Neurological: He is alert and oriented to person, place, and time. He has normal strength and normal reflexes. No cranial nerve deficit or sensory deficit.  Skin: Skin is warm and dry. Capillary refill takes less than 2 seconds.  Psychiatric: He has a normal mood and affect. His behavior is normal.  Nursing note and vitals reviewed.   ED Treatments / Results  Labs (all labs ordered are listed, but only abnormal results are displayed) Labs Reviewed  BASIC METABOLIC PANEL - Abnormal; Notable for the following:       Result Value   Glucose, Bld 122 (*)    All other components within normal limits  CBC  I-STAT TROPOININ, ED  I-STAT TROPOININ, ED    EKG Family and was   EKG Interpretation  Date/Time:  Wednesday May 08 2016 13:28:14 EDT Ventricular Rate:  76 PR Interval:  142 QRS Duration: 86 QT Interval:  392 QTC Calculation: 441 R Axis:   75 Text Interpretation:  Normal sinus rhythm with sinus arrhythmia Septal infarct , age undetermined Abnormal ECG Normal sinus rhythm Confirmed by Gerald Leitz ([id number]) on 05/08/2016 2:54:09 PM       EKG Interpretation  Date/Time:  Wednesday May 08 2016 13:28:14 EDT Ventricular Rate:  76 PR Interval:  142 QRS Duration: 86 QT Interval:  392 QTC Calculation: 441 R Axis:   75 Text  Interpretation:  Normal sinus rhythm with sinus arrhythmia Septal infarct , age undetermined Abnormal ECG Normal sinus rhythm Confirmed by Gerald Leitz ([id number]) on 05/08/2016 2:54:09 PM       R is on Bactrimadiology Dg Chest 2 View  Result Date: 05/08/2016 CLINICAL DATA:  Chest pain and shortness of breath beginning today. EXAM: CHEST  2 VIEW COMPARISON:  CT chest and PA and lateral chest 10/27/2015. FINDINGS: The lungs are clear. Heart size is normal. No pneumothorax or pleural effusion. No focal bony abnormality. IMPRESSION: Negative chest. Electronically Signed   By: Inge Rise M.D.   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 Multiplanar CT image reconstructions and MIPs were obtained to evaluate the vascular anatomy. CONTRAST:  100 mL of Isovue 370 intravenous contrast COMPARISON:  Current chest radiograph.  Chest CTA, 10/27/2015 FINDINGS: Cardiovascular: No evidence of a pulmonary embolus. The great vessels normal caliber. No aortic dissection. No atherosclerotic plaque along the aorta. Heart is normal in size and configuration. No coronary artery calcifications. Mediastinum/Nodes: No enlarged mediastinal, hilar, or axillary lymph nodes. Thyroid gland, trachea, and esophagus demonstrate no significant findings. Lungs/Pleura: Lungs are clear. No pleural effusion or pneumothorax. Upper Abdomen: Fatty infiltration of the liver. 17 mm low-density left adrenal  mass consistent with an adenoma no acute findings. Musculoskeletal: No chest wall abnormality. No acute or significant osseous findings. Review of the MIP images confirms the above findings. IMPRESSION: 1. No evidence of a pulmonary embolus. 2. No acute findings. 3. No vascular abnormality.  No coronary artery calcifications. Electronically Signed   By: Lajean Manes M.D.   On: 05/08/2016 17:32    Procedures (including critical care time) Procedures  Medications Ordered in ED Medications  nitroGLYCERIN (NITROSTAT) SL tablet 0.4 mg (0.4 mg Sublingual Given 05/08/16 1617)  acetaminophen (TYLENOL) tablet 1,000 mg (1,000 mg Oral Given 05/08/16 1616)  iopamidol (ISOVUE-370) 76 % injection (not administered)  aspirin chewable tablet 324 mg (324 mg Oral Given 05/08/16 1617)    Initial Impression / Assessment and Plan / ED Course  I have  reviewed the triage vital signs and the nursing notes.  Pertinent labs & imaging results that were available during my care of the patient were reviewed by me and considered in my medical decision making (see chart for details).  Clinical Course   Benjamin Blackburn presents to the ED For assessment of chest pain event concerning for ACS and given patient's high risk of cardiac disease.   HEAR Clinical Decision Tool used for ACS Risk Stratification. Their Risk Score was 4. (A HEAR Score of 0-3 is Low, a Score of 4-8 is High)  Given patient has no known CAD, risk stratification of the patient's H&P, risk factors & ECG was performed. I also obtained serial cardiac troponin to further evaluate for ACS. In light of above, I believe their risk for ACS remains too high for discharge at this time. Therefore through shared decision making with the patient, will plan for either admission or observation protocol to consider further Cardiac testing. Patient in agreement.  Patient provided 324 mg aspirin and sublingual nitroglycerin which resulted in improvement in chest  pain.  EKG obtained upon my review appreciate no evidence of acute STEMI or other arrhythmia and patient has no concerning anatomical contiguous ST segment or T-wave changes.  Given patient is also high risk with multiple previous DVTs patient is too high risk to obtain d-dimer therefore we will obtain CT chest angiography to look for pulmonary embolism. Patient has a palpable cord over the posterior portion of the right lower extremity therefore will require DVT scan of right lower extremity should CT PE study be negative. I also obtained screening laboratory work.  Upon my review patient's laboratory work and imaging I appreciate Troponin and other labs are unremarkable. CT PE study reveals no PE or other acute concerns.  In light of above & the complexity of the patient's medical condition, I believe the patient will require admission for continued medical intervention. ED Course in its entirety was reviewed w/ the patient. They are in agreement. I therefore consulted the Hospitalist Service for admission & we discussed my concerns regarding the patient's ED course & they have agreed to admit. I relayed concern that patient would require DVT study as inpatient for concerns of RLE DVT however does not require this in the ED.  They have no further concerns or requests prior to the patient's transport from the ED. Patient stable for transport. Level of Care determined by the Admitting Service.  Patient care discussed w/ my attending physician, who oversaw & agreed w/ their evaluation & treatment.  Final Clinical Impressions(s) / ED Diagnoses   Final diagnoses:  Chest pain, unspecified chest pain type  Leg swelling   New Prescriptions New Prescriptions   No medications on file    Patient care discussed with the attending physician, who oversaw their evaluation & treatment & voiced agreement.  Note: This document was prepared using Dragon voice recognition software and may include  unintentional dictation errors.  House Officer: Voncille Lo, MD, Emergency Medicine Resident.    Voncille Lo, MD 05/08/16 [place], MD 05/08/16 [place], MD 05/11/16 2042

## 2016-05-08 NOTE — ED Triage Notes (Signed)
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 States that the pain is not made worse with movement or inspiration. Appears anxious on assessment, hx of DVT

## 2016-05-09 ENCOUNTER — Encounter (HOSPITAL_COMMUNITY)
Admission: EM | Disposition: A | Discharge status: Home / Self Care | Payer: Self-pay | Source: Home / Self Care | Attending: Physician Assistant

## 2016-05-09 ENCOUNTER — Observation Stay (HOSPITAL_BASED_OUTPATIENT_CLINIC_OR_DEPARTMENT_OTHER): Payer: BLUE CROSS/BLUE SHIELD

## 2016-05-09 DIAGNOSIS — M79604 Pain in right leg: Secondary | ICD-10-CM | POA: Diagnosis not present

## 2016-05-09 DIAGNOSIS — I10 Essential (primary) hypertension: Secondary | ICD-10-CM | POA: Diagnosis not present

## 2016-05-09 DIAGNOSIS — E785 Hyperlipidemia, unspecified: Secondary | ICD-10-CM | POA: Diagnosis not present

## 2016-05-09 DIAGNOSIS — M7989 Other specified soft tissue disorders: Secondary | ICD-10-CM | POA: Diagnosis not present

## 2016-05-09 DIAGNOSIS — R079 Chest pain, unspecified: Secondary | ICD-10-CM

## 2016-05-09 DIAGNOSIS — R51 Headache: Secondary | ICD-10-CM | POA: Diagnosis not present

## 2016-05-09 DIAGNOSIS — R072 Precordial pain: Secondary | ICD-10-CM

## 2016-05-09 DIAGNOSIS — I2510 Atherosclerotic heart disease of native coronary artery without angina pectoris: Secondary | ICD-10-CM | POA: Diagnosis not present

## 2016-05-09 HISTORY — PX: CARDIAC CATHETERIZATION: SHX172

## 2016-05-09 LAB — LIPID PANEL
CHOL/HDL RATIO: 6.2 ratio
CHOLESTEROL: 199 mg/dL (ref 0–200)
HDL: 32 mg/dL — ABNORMAL LOW (ref >40)
LDL CALC: 95 mg/dL (ref 0–99)
Triglycerides: 362 mg/dL — ABNORMAL HIGH (ref <150)
VLDL: 72 mg/dL — AB (ref 0–40)

## 2016-05-09 LAB — RAPID URINE DRUG SCREEN, HOSP PERFORMED
Amphetamines: NOT DETECTED
Barbiturates: NOT DETECTED
Benzodiazepines: NOT DETECTED
COCAINE: NOT DETECTED
OPIATES: NOT DETECTED
TETRAHYDROCANNABINOL: NOT DETECTED

## 2016-05-09 LAB — CBC
HEMATOCRIT: 47.2 % (ref 39.0–52.0)
HEMOGLOBIN: 16.1 g/dL (ref 13.0–17.0)
MCH: 29.7 pg (ref 26.0–34.0)
MCHC: 34.1 g/dL (ref 30.0–36.0)
MCV: 86.9 fL (ref 78.0–100.0)
Platelets: 219 10*3/uL (ref 150–400)
RBC: 5.43 MIL/uL (ref 4.22–5.81)
RDW: 12.3 % (ref 11.5–15.5)
WBC: 7.9 10*3/uL (ref 4.0–10.5)

## 2016-05-09 LAB — PROTIME-INR
INR: 0.98
PROTHROMBIN TIME: 13 s (ref 11.4–15.2)

## 2016-05-09 LAB — TROPONIN I

## 2016-05-09 LAB — TSH: TSH: 3.587 u[IU]/mL (ref 0.350–4.500)

## 2016-05-09 SURGERY — LEFT HEART CATH AND CORONARY ANGIOGRAPHY

## 2016-05-09 MED ORDER — SODIUM CHLORIDE 0.9% FLUSH: 3.0000 mL | Freq: Two times a day (BID) | INTRAVENOUS | Status: DC

## 2016-05-09 MED ORDER — SODIUM CHLORIDE 0.9 % WEIGHT BASED INFUSION
1.0000 mL/kg/h | INTRAVENOUS | Status: AC
  Administered 2016-05-09: 1 mL/kg/h via INTRAVENOUS

## 2016-05-09 MED ORDER — FENTANYL CITRATE (PF) 100 MCG/2ML IJ SOLN
INTRAMUSCULAR | Status: AC
  Filled 2016-05-09: qty 2

## 2016-05-09 MED ORDER — LIDOCAINE HCL (PF) 1 % IJ SOLN
INTRAMUSCULAR | Status: DC | PRN
  Administered 2016-05-09: 4 mL via INTRADERMAL

## 2016-05-09 MED ORDER — IOPAMIDOL (ISOVUE-370) INJECTION 76%
INTRAVENOUS | Status: AC
  Filled 2016-05-09: qty 100

## 2016-05-09 MED ORDER — ASPIRIN 81 MG PO CHEW: 81.0000 mg | CHEWABLE_TABLET | ORAL | Status: DC

## 2016-05-09 MED ORDER — MIDAZOLAM HCL 2 MG/2ML IJ SOLN
INTRAMUSCULAR | Status: AC
  Filled 2016-05-09: qty 2

## 2016-05-09 MED ORDER — FENTANYL CITRATE (PF) 100 MCG/2ML IJ SOLN
INTRAMUSCULAR | Status: DC | PRN
  Administered 2016-05-09: 50 ug via INTRAVENOUS
  Administered 2016-05-09: 25 ug via INTRAVENOUS

## 2016-05-09 MED ORDER — SODIUM CHLORIDE 0.9 % IV SOLN: INTRAVENOUS | Status: DC

## 2016-05-09 MED ORDER — HEPARIN SODIUM (PORCINE) 1000 UNIT/ML IJ SOLN
INTRAMUSCULAR | Status: DC | PRN
  Administered 2016-05-09: 6000 [IU] via INTRAVENOUS

## 2016-05-09 MED ORDER — MIDAZOLAM HCL 2 MG/2ML IJ SOLN
INTRAMUSCULAR | Status: DC | PRN
  Administered 2016-05-09: 2 mg via INTRAVENOUS
  Administered 2016-05-09: 1 mg via INTRAVENOUS

## 2016-05-09 MED ORDER — IOPAMIDOL (ISOVUE-370) INJECTION 76%
INTRAVENOUS | Status: DC | PRN
  Administered 2016-05-09: 40 mL via INTRA_ARTERIAL

## 2016-05-09 MED ORDER — SODIUM CHLORIDE 0.9% FLUSH: 3.0000 mL | INTRAVENOUS | Status: DC | PRN

## 2016-05-09 MED ORDER — HEPARIN (PORCINE) IN NACL 2-0.9 UNIT/ML-% IJ SOLN
INTRAMUSCULAR | Status: DC | PRN
  Administered 2016-05-09: 1500 mL via INTRA_ARTERIAL

## 2016-05-09 MED ORDER — SODIUM CHLORIDE 0.9 % IV SOLN: 250.0000 mL | INTRAVENOUS | Status: DC | PRN

## 2016-05-09 MED ORDER — CHLORTHALIDONE 25 MG PO TABS
25.0000 mg | ORAL_TABLET | Freq: Every day | ORAL | Status: DC
  Administered 2016-05-09 – 2016-05-10 (×2): 25 mg via ORAL
  Filled 2016-05-09 (×3): qty 1

## 2016-05-09 MED ORDER — ACETAMINOPHEN 325 MG PO TABS: 650.0000 mg | ORAL_TABLET | ORAL | Status: DC | PRN

## 2016-05-09 MED ORDER — VERAPAMIL HCL 2.5 MG/ML IV SOLN
INTRAVENOUS | Status: AC
  Filled 2016-05-09: qty 2

## 2016-05-09 MED ORDER — LIDOCAINE HCL (PF) 1 % IJ SOLN
INTRAMUSCULAR | Status: AC
Start: 2016-05-09 — End: 2016-05-09
  Filled 2016-05-09: qty 30

## 2016-05-09 MED ORDER — MORPHINE SULFATE (PF) 4 MG/ML IV SOLN
4.0000 mg | Freq: Once | INTRAVENOUS | Status: AC
  Administered 2016-05-09: 4 mg via INTRAVENOUS
  Filled 2016-05-09: qty 1

## 2016-05-09 MED ORDER — VERAPAMIL HCL 2.5 MG/ML IV SOLN
INTRAVENOUS | Status: DC | PRN
  Administered 2016-05-09: 10 mL via INTRA_ARTERIAL

## 2016-05-09 MED ORDER — HEPARIN SODIUM (PORCINE) 1000 UNIT/ML IJ SOLN
INTRAMUSCULAR | Status: AC
  Filled 2016-05-09: qty 1

## 2016-05-09 MED ORDER — ONDANSETRON HCL 4 MG/2ML IJ SOLN: 4.0000 mg | Freq: Four times a day (QID) | INTRAMUSCULAR | Status: DC | PRN

## 2016-05-09 MED ORDER — HEPARIN (PORCINE) IN NACL 2-0.9 UNIT/ML-% IJ SOLN
INTRAMUSCULAR | Status: AC
  Filled 2016-05-09: qty 1500

## 2016-05-09 SURGICAL SUPPLY — 8 items
CATH IMPULSE 5F ANG/FL3.5 (CATHETERS) ×2 IMPLANT
DEVICE RAD COMP TR BAND LRG (VASCULAR PRODUCTS) ×2 IMPLANT
GLIDESHEATH SLEND SS 6F .021 (SHEATH) ×2 IMPLANT
KIT HEART LEFT (KITS) ×2 IMPLANT
PACK CARDIAC CATHETERIZATION (CUSTOM PROCEDURE TRAY) ×2 IMPLANT
TRANSDUCER W/STOPCOCK (MISCELLANEOUS) ×2 IMPLANT
TUBING CIL FLEX 10 FLL-RA (TUBING) ×2 IMPLANT
WIRE SAFE-T 1.5MM-J .035X260CM (WIRE) ×2 IMPLANT

## 2016-05-09 NOTE — Discharge Summary (Signed)
[facility] Discharge Summary  Patient name: Benjamin Blackburn Medical record number: [id number] Date of birth: 11/24/1980 Age: 35 y.o. Gender: male Date of Admission: 05/08/2016  Date of Discharge: 05/10/2016 Admitting Physician: Dickie La, MD  Primary Care Provider: No PCP Per Patient Consultants: Cardiology  Indication for Hospitalization: Chest Pain  Discharge Diagnoses/Problem List:  Chest Pain w/ FHx of early onset CAD Right Sided Headache Hyperlipidemia Hypertension H/o DVT Adrenal Adenoma NAFLD/NASH OSA Depression  Disposition: Home  Discharge Condition: Stable, improved  Discharge Exam:  General: well nourished, well developed, in no acute distress with non-toxic appearance HEENT: PERRLA, EOMI, normocephalic, atraumatic, moist mucous membranes, tenderness at right temporal region Neck: supple, non-tender without lymphadenopathy CV: regular rate and rhythm without murmurs, rubs, or gallops Lungs: clear to auscultation bilaterally with normal work of breathing Abdomen: soft, non-tender, no masses or organomegaly palpable, normoactive bowel sounds Skin: warm, dry, no rashes or lesions, cap refill < 2 seconds Extremities: warm and well perfused, normal tone Neuro: CNII-XII intact w/o slurring or speech, stable and appropriate gait  Brief Hospital Course:  Benjamin Blackburn is a 35 y.o. male presented with chest pain. 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 Of note, father had 3 MI by age 63, 2 of which in his 81s.  Onset 05/08/16 around noon while at work. Patient experienced palpitations with central CP radiating to L shoulder. Other symptoms included diaphoresis and lightheadedness. Patient denies SOB, change in vision, LOC, nausea or vomiting. Palpitations quickly resolved and CP eased with nitro administration. Please refer to H&P for more detail.  During  admission, patient was given aspirin. Troponin neg. EKG neg for ST changes. Due to DVT history, CTA performed neg for PE. Dopplers also neg for DVT. Vitals remained stable during admission. Due to significant paternal cardiac history, cardiology was consulted and performed L-sided heart cath which showed no CAD. Patient did experience R-sided h/a s/p L-heart cath with right sided blurred vision and nausea. Vision and nausea resolved. Headache improved prior to discharge.  Patient was discharge on 05/10/16 with PCP f/u. Does not need cardiology f/u per cards rec.  Issues for Follow Up:  1. Patient will f/u for hospitalization ACS r/o with new PCP on 10/3 2. Right sided h/a s/p cath with blurred vision, needs reassessment during PCP f/u, could be related to deceased caffeine intake per patient, did improve with resolution of blurred vision and w/o neurological effects 3. Pulmonary nodule (0.4 cm left lung base) that was seen incidentally on CT imaging, but CT in 2016 made no mention of this 4. Has CPAP, make sure patient is complaint as he has been experiencing PND 5. Smoking cessation as of 5 months ago, continue to encourage not to smoke 6. No cardiology f/u needed per cards rec 7. Continue prescribed Protonix 40 mg PO for concerns for reflux 8. Continue home Lisinopril 10 mg and discontinue HCTZ, prescribed chlorthalidone 25 mg daily for BP per cardiology recs 9. No indication for statin use due to ASCVD risk <5%, continue Fenofibrate for hypertriglyceridemia, LDL at goal, no CAD on cath 10. Discuss use of aspirin 81 mg at PCP f/u  Significant Procedures: L-sided heart catheterization  Significant Labs and Imaging:   Recent Labs Lab 05/08/16 2102 05/09/16 1438 05/10/16 0316  WBC 7.9 7.9 8.5  HGB 15.7 16.1 15.6  HCT 45.3 47.2 46.5  PLT 229 219 249    Recent Labs  Lab 05/08/16 1335 05/08/16 2102 05/10/16 0316  NA 137  --  136  K 4.2  --  3.9  CL 104  --  104  CO2 22  --  23   GLUCOSE 122*  --  90  BUN 11  --  9  CREATININE 1.15 1.03 0.95  CALCIUM 9.6  --  9.2   Lipid Panel     Component Value Date/Time   CHOL 199 05/09/2016 0148   TRIG 362 (H) 05/09/2016 0148   HDL 32 (L) 05/09/2016 0148   CHOLHDL 6.2 05/09/2016 0148   VLDL 72 (H) 05/09/2016 0148   LDLCALC 95 05/09/2016 0148   Troponin:  Neg x3 A1C:  5.1 TSH:  WNL UDS:  Neg  DG Chest 2 View (05/08/16) FINDINGS: The lungs are clear. Heart size is normal. No pneumothorax or pleural effusion. No focal bony abnormality.  IMPRESSION: Negative chest.  CT Angio Chest PE W and/or Wo Contrast (05/08/16) FINDINGS: Cardiovascular: No evidence of a pulmonary embolus. The great vessels normal caliber. No aortic dissection. No atherosclerotic plaque along the aorta.  Heart is normal in size and configuration. No coronary artery calcifications.  Mediastinum/Nodes: No enlarged mediastinal, hilar, or axillary lymph nodes. Thyroid gland, trachea, and esophagus demonstrate no significant findings.  Lungs/Pleura: Lungs are clear. No pleural effusion or pneumothorax.  Upper Abdomen: Fatty infiltration of the liver. 17 mm low-density left adrenal mass consistent with an adenoma no acute findings.  Musculoskeletal: No chest wall abnormality. No acute or significant osseous findings.  Review of the MIP images confirms the above findings.  IMPRESSION: 1. No evidence of a pulmonary embolus. 2. No acute findings. 3. No vascular abnormality. No coronary artery calcifications.  Results/Tests Pending at Time of Discharge: None  Discharge Medications:    Medication List    STOP taking these medications   buPROPion 150 MG 12 hr tablet Commonly known as:  WELLBUTRIN SR   lisinopril-hydrochlorothiazide 10-12.5 MG tablet Commonly known as:  PRINZIDE,ZESTORETIC   predniSONE 10 MG (21) Tbpk tablet Commonly known as:  STERAPRED UNI-PAK 21 TAB   sulfamethoxazole-trimethoprim 800-160 MG  tablet Commonly known as:  BACTRIM DS,SEPTRA DS     TAKE these medications   calcium carbonate 500 MG chewable tablet Commonly known as:  TUMS - dosed in mg elemental calcium Chew 1-2 tablets by mouth daily as needed for indigestion.   chlorthalidone 25 MG tablet Commonly known as:  HYGROTON Take 1 tablet (25 mg total) by mouth daily.   fenofibrate 54 MG tablet Take 54 mg by mouth daily.   lisinopril 10 MG tablet Commonly known as:  PRINIVIL,ZESTRIL Take 1 tablet (10 mg total) by mouth daily.   pantoprazole 40 MG tablet Commonly known as:  PROTONIX Take 1 tablet (40 mg total) by mouth daily.   rOPINIRole 1 MG tablet Commonly known as:  REQUIP Take 1 mg by mouth at bedtime.       Discharge Instructions: Please refer to Patient Instructions section of EMR for full details.  Patient was counseled important signs and symptoms that should prompt return to medical care, changes in medications, dietary instructions, activity restrictions, and follow up appointments.   Follow-Up Appointments: Follow-up Information    [place] Bing, DO. Go on 05/14/2016.   Why:  Hospital follow up appointment at 3:00 PM Contact information: [place] [address] [phone]           [place] Bing, DO 05/10/2016, 12:07 PM PGY-1, [place]

## 2016-05-09 NOTE — Progress Notes (Signed)
VASCULAR LAB PRELIMINARY  PRELIMINARY  PRELIMINARY  PRELIMINARY  Right lower extremity venous duplex completed.    Preliminary report:  There is no DVT or SVT noted in the right lower extremity.  There are enlarged inguinal lymph nodes noted bilaterally.    Jacqeline Broers, RVT 05/09/2016, 12:09 PM

## 2016-05-09 NOTE — H&P (View-Only) (Signed)
Cardiology Consult    Patient ID: DILPREET MODESTO MRN: [id number], DOB/AGE: November 10, 1980   Admit date: 05/08/2016 Date of Consult: 05/09/2016  Primary Physician: No PCP Per Patient Reason for Consult: Chest pain  Primary Cardiologist: New  Requesting Provider: Dr. Yisroel Ramming   Patient Profile  Mr. Wilner is a 35 year old male with a past medical history of 2 cm adrenal adenoma for which he is followed by Endocrinology at [facility], HLD, HTN, ongoing tobacco abuse, chronic diarrhea, OSA non compliant with CPAP, obesity. He has a significant family history of CAD. Cardiology consulted for chest pain.   History of Present Illness  Mr. Carroway was getting off the elevator yesterday at work and developed acute onset substernal sharp chest pain that radiated to his left shoulder.  He felt dyspneic and became profusely diaphoretic. He tells me that his pain was so intense that he had to stop and hold onto the elevator. Immediately preceding his acute pain, he felt palpitations in his heart. About 15-20 seconds later his chest pain subsided enough that he could walk but did not abate completely.   He walked to the cafeteria and tried to eat and fan himself but still felt dull chest pressure. He then went to the emergency room. In the ED his EKG showed NSR with some t wave inversion in III and aVF. No prior EKG to compare to. His troponin is negative x3. He is currently chest pain free, but tells me for the past 6 months he has daily sharp chest pain at rest with radiation to his left shoulder associated with diaphoresis. These episodes last a few minutes but almost always occur at rest. He works for [employer] and denies any heavy lifting or exertional chest pain. His pain in not reproducible with palpation or exacerbated with movement or position.  Mr. Poole has a significant family history of MI. His father has had 3 MI's - his father's first MI was at the age of 4 and he has had CABG x 2.  Patient is a former smoker, he quit 5 months ago. Patient also with risk factors of CAD including HTN and HLD, currently medication compliant with fenofibrate and ACE-I.   Also of note, Mr. Nybo says he really hasn't felt well in the past year, but the past 6 months has felt more fatigued and like he can't fully concentrate. He reports a history of sleep apnea, and has a CPAP machine and says often he is awakened gasping for breath. Also, tells me that frequently he has lower extremity edema.   Currently he denies chest pain, SOB and palpitations. He has a history of DVT and CT Angio chest ruled out PE, he did not have coronary calcifications on his chest CT. He has a documented history of a pulmonary nodule (0.4 cm left lung base) that was seen incidentally on CT imaging, but has had a CT in 2016 and this admission that does not mention any mass or abnormalities within his lungs.    Past Medical History   Past Medical History:  Diagnosis Date  . Adrenal abnormality (HCC)    mass on adrenal gland  . 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 E coli infection 03/2016   completed treatment  . Hiatal hernia   . Hypertension   . Lung mass   . Multiple gastric ulcers     Past Surgical History:  Procedure Laterality Date  . APPENDECTOMY    .  NASAL SEPTUM SURGERY    . TONSILLECTOMY       Allergies  Allergies  Allergen Reactions  . Amoxicillin Rash  . Latex Rash  . Penicillins Rash    Has patient had a PCN reaction causing immediate rash, facial/tongue/throat swelling, SOB or lightheadedness with hypotension: Yes Has patient had a PCN reaction causing severe rash involving mucus membranes or skin necrosis: No Has patient had a PCN reaction that required hospitalization No Has patient had a PCN reaction occurring within the last 10 years: No If all of the above answers are "NO", then may proceed with Cephalosporin use.   . Tape Rash  . Tetracyclines & Related Rash    Inpatient  Medications    . enoxaparin (LOVENOX) injection  40 mg Subcutaneous Q24H  . fenofibrate  54 mg Oral Daily  . lisinopril  10 mg Oral Daily   And  . hydrochlorothiazide  12.5 mg Oral Daily  . pantoprazole  40 mg Oral Daily  . rOPINIRole  1 mg Oral QHS  . sulfamethoxazole-trimethoprim  1 tablet Oral BID    Family History    No family history on file.  Social History    Social History   Social History  . Marital status: Single    Spouse name: N/A  . Number of children: N/A  . Years of education: N/A   Occupational History  . Not on file.   Social History Main Topics  . Smoking status: Former Research scientist (life sciences)  . Smokeless tobacco: Never Used  . Alcohol use No  . Drug use: No  . Sexual activity: Not on file   Other Topics Concern  . Not on file   Social History Narrative  . No narrative on file     Review of Systems    General:  No chills, fever, night sweats or weight changes.  Cardiovascular:  + chest pain, dyspnea on exertion, + edema, orthopnea, + palpitations, + paroxysmal nocturnal dyspnea. Dermatological: No rash, lesions/masses Respiratory: No cough, dyspnea Urologic: No hematuria, dysuria Abdominal:   No nausea, vomiting, + diarrhea, bright red blood per rectum, melena, or hematemesis Neurologic:  No visual changes, wkns, changes in mental status. All other systems reviewed and are otherwise negative except as noted above.  Physical Exam    Blood pressure (!) 133/59, pulse (!) 56, temperature 97.7 F (36.5 C), temperature source Oral, resp. rate 18, height 6\' 2"  (1.88 m), weight 264 lb 3.2 oz (119.8 kg), SpO2 98 %.  General: Pleasant, NAD Psych: Normal affect. Neuro: Alert and oriented X 3. Moves all extremities spontaneously. HEENT: Normal  Neck: Supple without bruits or JVD. Lungs:  Resp regular and unlabored, CTA. Heart: RRR no s3, s4, or murmurs. Abdomen: Soft, non-tender, non-distended, BS + x 4.  Extremities: No clubbing, cyanosis or edema.  DP/PT/Radials 2+ and equal bilaterally.   Labs    Troponin [facility] Test)  Recent Labs  05/08/16 1710  TROPIPOC 0.01    Recent Labs  05/08/16 2102 05/09/16 0148 05/09/16 0825  TROPONINI <0.03 <0.03 <0.03   Lab Results  Component Value Date   WBC 7.9 05/08/2016   HGB 15.7 05/08/2016   HCT 45.3 05/08/2016   MCV 87.5 05/08/2016   PLT 229 05/08/2016    Recent Labs Lab 05/02/16 1755 05/08/16 1335 05/08/16 2102  NA 136 137  --   K 4.0 4.2  --   CL 105 104  --   CO2 25 22  --   BUN 12 11  --  CREATININE 1.00 1.15 1.03  CALCIUM 9.1 9.6  --   PROT 7.2  --   --   BILITOT 0.5  --   --   ALKPHOS 52  --   --   ALT 66*  --   --   AST 32  --   --   GLUCOSE 91 122*  --    Lab Results  Component Value Date   CHOL 199 05/09/2016   HDL 32 (L) 05/09/2016   LDLCALC 95 05/09/2016   TRIG 362 (H) 05/09/2016   No results found for: [facility]   Radiology Studies    Dg Chest 2 View  Result Date: 05/08/2016 CLINICAL DATA:  Chest pain and shortness of breath beginning today. EXAM: CHEST  2 VIEW COMPARISON:  CT chest and PA and lateral chest 10/27/2015. FINDINGS: The lungs are clear. Heart size is normal. No pneumothorax or pleural effusion. No focal bony abnormality. IMPRESSION: Negative chest. Electronically Signed   By: Inge Rise M.D.   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 Multiplanar CT image reconstructions and MIPs were obtained  to evaluate the vascular anatomy. CONTRAST:  100 mL of Isovue 370 intravenous contrast COMPARISON:  Current chest radiograph.  Chest CTA, 10/27/2015 FINDINGS: Cardiovascular: No evidence of a pulmonary embolus. The great vessels normal caliber. No aortic dissection. No atherosclerotic plaque along the aorta. Heart is normal in size and configuration. No coronary artery calcifications. Mediastinum/Nodes: No enlarged mediastinal, hilar, or axillary lymph nodes. Thyroid gland, trachea, and esophagus demonstrate no significant findings. Lungs/Pleura: Lungs are clear. No pleural effusion or pneumothorax. Upper Abdomen: Fatty infiltration of the liver. 17 mm low-density left adrenal mass consistent with an adenoma no acute findings. Musculoskeletal: No chest wall abnormality. No acute or significant osseous findings. Review of the MIP images confirms the above findings. IMPRESSION: 1. No evidence of a pulmonary embolus. 2. No acute findings. 3. No vascular abnormality.  No coronary artery calcifications. Electronically Signed   By: Lajean Manes M.D.   On: 05/08/2016 17:32    EKG & Cardiac Imaging    EKG: NSR with non specific ST changes in anterolateral leads.   Echocardiogram: pending.   Assessment & Plan    1. Chest pain with strong family history of CAD: Presents with substernal chest pain that radiated to his left shoulder associated with nausea, diaphoresis and SOB. He describes daily angina with associated diaphoresis and has a family history of early onset MI. His father had his first MI at the age of 72.   Troponin negative x 3, EKG not concerning for active ischemia, but with some non specific changes. Also reassuring that CTA did not show any coronary artery calcification. However, patient would benefit from definitive left heart cath given significant risk for CAD.   Will also order echo to evaluate EF with his description of PND and edema.   2. HLD: LDL is 95, at goal (,100). Continue  fenofibrate. If he is  found to have CAD he will need high intensity statin. His A1c is pending.   3. HTN: On HCTZ and lisinopril 10mg  outpatient. Seems to be well controlled. Would favor adding chlorthalidone 25mg  daily and discontinuing HCTZ.    4. History of DVT: has previously been on coumadin, but not currently on any anticoagulation. His father also has history of DVT. Question whether there is a familial hypercoagulable disorder. He denies ever having any work for this.    Signed, Arbutus Leas, NP 05/09/2016, 12:00 PM Pager: [phone]  History and all data above reviewed.  Patient examined.  I agree with the findings as above.  The patient has chest pain that was severe and at rest.  No prior cardiac history but he does have a strong family history with his father having heart disease in his 37s.  No objective evidence of ischemia although his EKG had some non specific changes.  Pain did improve about 10 minutes after NTG.   The patient exam reveals COR:RRR, no rub  ,  Lungs: .clear  ,  Abd: Positive bowel sounds, no rebound no guarding, Ext No edema  .  All available labs, radiology testing, previous records reviewed. Agree with documented assessment and plan. CHEST PAIN:  Atypical and typical features in a patient with a strong family history of CAD.  Plan coronary CT angiography vs cardiac cath depending on the availability of either test.    Minus Breeding  1:38 PM  05/09/2016

## 2016-05-09 NOTE — Progress Notes (Signed)
Family Medicine Teaching Service Daily Progress Note Intern Pager: [phone]  Patient name: Benjamin Blackburn Medical record number: [id number] Date of birth: Sep 29, 1980 Age: 35 y.o. Gender: male  Primary Care Provider: No PCP Per Patient Consultants: Cardiology Code Status: FULL  Pt Overview and Major Events to Date:  9/27:  Admit for CP with ACS r/o 9/28:  Neg troponin and unremarkable EKG with resolution of CP, cards consulted for evaluation  Assessment and Plan: Benjamin Blackburn is a 35 y.o. male presenting with chest pain . 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   #Chest pain: HEART score is 3 secondary to nonspecific repolarization on EKG and risk factors. His father had 3 MIs prior to the age of 67 and the patient reportedly has never had a cardiac work up. EKG without ST elevation or depression. Troponin negative.  Given h/o unprovoked DVTs, there was concerned for PE, however CTA was negative. All labwork unremarkable. CXR clear. Chest pain not reproducible on exam making MSK etiology less likely.  Also on the DDx is GI related (as pt has a h/o epigastric pain and is tender in the epigastric region) and anxiety given palpitations prior to the onset of pain.  - Observation for ACS r/o - On telemetry - Risk stratification labs pending A1C  - Notes Dr. 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 Notes he was treated with Lovenox and Coumadin but has been off this treatment for some time. States he's seen a vascular doctor but denies any hypercoagulable work up. Noted to have tenderness and a slight bulging just inferior to  the R popliteal fossa.  - Doppler of the RLE pending for DVT - Consider hypercoagulable work up in the future  #HLD: currently on fenofibrate, lipid panel with high TGY and low HDL - Consider addition of statin per results and ASCVD score  #HTN: on lisinopril/HCTZ 10/12.5 mg by mouth once daily. Blood pressure at goal. - Continue home regimen  #H/o cellulitis of the nose: not noted on my exam. - Continue 1 more dose of Bactrim  #Restless leg syndrome:  - Continue home Requip   #Chronic fatigue: notes orthopnea as well. States he's had a sleep study in the past. Was not diagnosed with apnea but notes he has a CPAP to wear intermittently for fatigue which he doesn't wear? - Will hold off on CPAP for now  #Depression: was on Wellbutrin but states he self discontinued this as has been doing well. He doesn't feel like he needs this any more  #Epigastric pain, chronic: sees GI at [facility].  EGD March 2017 showed: "Mild esophagitis, Irregular Z-line, Small hiatal hernia, Moderate erosive gastritis, Tiny shallow antral ulcer, Mild duodenitis" Biopsies showed inflammation in his stomach and esophagus but were otherwise negative for celiac disease, H. Pylori, or Barrett's esophagus.  #Adrenal mass: ~2cm adrenal adenoma on the left.  Sees Dr. Koleen Nimrod with endocrinology at [facility]. Appears stable on CTA here from previous findings.   #NAFLD/NASH: Noted during previous hospitalizations. Fatty infiltration of the liver noted on CTA today  FEN/GI: heart healthy diet/PPi/ IV saline lock Prophylaxis: Lovenox   Disposition: awaiting  cards rec, anticipate d/c to home with unremarkable ACS workup and resolution of CP  Subjective:  Afebrile and stable. No CP or SOB overnight. Says he feels great and ready to go. Discussed being seen by cards due to family history. Patient is in agreement.  Objective: Temp:  [97.5 F (36.4 C)-98 F (36.7 C)] 97.7 F (36.5 C) (09/28 0440) Pulse Rate:  [55-79]  56 (09/28 0440) Resp:  [12-20] 18 (09/28 0440) BP: (113-163)/(64-111) 124/71 (09/28 0440) SpO2:  [95 %-99 %] 98 % (09/28 0440) Weight:  [264 lb 3.2 oz (119.8 kg)] 264 lb 3.2 oz (119.8 kg) (09/27 2059) Physical Exam: General: well nourished, well developed, in no acute distress with non-toxic appearance HEENT: normocephalic, atraumatic, moist mucous membranes Neck: supple, non-tender without lymphadenopathy CV: regular rate and rhythm without murmurs, rubs, or gallops Lungs: clear to auscultation bilaterally with normal work of breathing Abdomen: soft, non-tender, no masses or organomegaly palpable, normoactive bowel sounds Skin: warm, dry, no rashes or lesions, cap refill < 2 seconds Extremities: warm and well perfused, normal tone  Laboratory:  Recent Labs Lab 05/02/16 1755 05/08/16 1335 05/08/16 2102  WBC 9.4 9.2 7.9  HGB 15.2 16.7 15.7  HCT 42.9 49.8 45.3  PLT 243 243 229    Recent Labs Lab 05/02/16 1755 05/08/16 1335 05/08/16 2102  NA 136 137  --   K 4.0 4.2  --   CL 105 104  --   CO2 25 22  --   BUN 12 11  --   CREATININE 1.00 1.15 1.03  CALCIUM 9.1 9.6  --   PROT 7.2  --   --   BILITOT 0.5  --   --   ALKPHOS 52  --   --   ALT 66*  --   --   AST 32  --   --   GLUCOSE 91 122*  --    Lipid Panel     Component Value Date/Time   CHOL 199 05/09/2016 0148   TRIG 362 (H) 05/09/2016 0148   HDL 32 (L) 05/09/2016 0148   CHOLHDL 6.2 05/09/2016 0148   VLDL 72 (H) 05/09/2016 0148   LDLCALC 95 05/09/2016 0148   Troponin x3:  Neg UDS:  Pend TSH:  WNL  Imaging/Diagnostic Tests: DG Chest 2 View (05/08/16) FINDINGS: The lungs are clear. Heart size is normal. No pneumothorax or pleural effusion. No focal bony abnormality.  IMPRESSION: Negative chest.  CT Angio Chest PE W and/or Wo Contrast (05/08/16) FINDINGS: Cardiovascular: No evidence of a pulmonary embolus. The great vessels normal caliber. No aortic dissection. No atherosclerotic plaque along the  aorta.  Heart is normal in size and configuration. No coronary artery calcifications.  Mediastinum/Nodes: No enlarged mediastinal, hilar, or axillary lymph nodes. Thyroid gland, trachea, and esophagus demonstrate no significant findings.  Lungs/Pleura: Lungs are clear. No pleural effusion or pneumothorax.  Upper Abdomen: Fatty infiltration of the liver. 17 mm low-density left adrenal mass consistent with an adenoma no acute findings.  Musculoskeletal: No chest wall abnormality. No acute or significant osseous findings.  Review of the MIP images confirms the above findings.  IMPRESSION: 1. No evidence of a pulmonary embolus. 2. No acute findings. 3. No vascular abnormality.  No coronary artery calcifications.    Licking Bing, DO 05/09/2016, 7:07 AM PGY-1, [place] Intern pager: [phone], text pages welcome

## 2016-05-09 NOTE — Interval H&P Note (Signed)
Cath Lab Visit (complete for each Cath Lab visit)  Clinical Evaluation Leading to the Procedure:   ACS: Yes.    Non-ACS:    Anginal Classification: CCS IV  Anti-ischemic medical therapy: Minimal Therapy (1 class of medications)  Non-Invasive Test Results: No non-invasive testing performed  Prior CABG: No previous CABG      History and Physical Interval Note:  05/09/2016 4:09 PM  Benjamin Blackburn  has presented today for surgery, with the diagnosis of cp  The various methods of treatment have been discussed with the patient and family. After consideration of risks, benefits and other options for treatment, the patient has consented to  Procedure(s): Left Heart Cath and Coronary Angiography (N/A) as a surgical intervention .  The patient's history has been reviewed, patient examined, no change in status, stable for surgery.  I have reviewed the patient's chart and labs.  Questions were answered to the patient's satisfaction.     Larae Grooms

## 2016-05-09 NOTE — Consult Note (Addendum)
Cardiology Consult    Patient ID: Benjamin Blackburn MRN: [id number], DOB/AGE: [birth year]   Admit date: 05/08/2016 Date of Consult: 05/09/2016  Primary Physician: No PCP Per Patient Reason for Consult: Chest pain  Primary Cardiologist: New  Requesting Provider: Dr. Yisroel Ramming   Patient Profile  Benjamin Blackburn is a 35 year old male with a past medical history of 2 cm adrenal adenoma for which he is followed by Endocrinology at [facility], HLD, HTN, ongoing tobacco abuse, chronic diarrhea, OSA non compliant with CPAP, obesity. He has a significant family history of CAD. Cardiology consulted for chest pain.   History of Present Illness  Benjamin Blackburn was getting off the elevator yesterday at work and developed acute onset substernal sharp chest pain that radiated to his left shoulder.  He felt dyspneic and became profusely diaphoretic. He tells me that his pain was so intense that he had to stop and hold onto the elevator. Immediately preceding his acute pain, he felt palpitations in his heart. About 15-20 seconds later his chest pain subsided enough that he could walk but did not abate completely.   He walked to the cafeteria and tried to eat and fan himself but still felt dull chest pressure. He then went to the emergency room. In the ED his EKG showed NSR with some t wave inversion in III and aVF. No prior EKG to compare to. His troponin is negative x3. He is currently chest pain free, but tells me for the past 6 months he has daily sharp chest pain at rest with radiation to his left shoulder associated with diaphoresis. These episodes last a few minutes but almost always occur at rest. He works for [employer] and denies any heavy lifting or exertional chest pain. His pain in not reproducible with palpation or exacerbated with movement or position.  Benjamin Blackburn has a significant family history of MI. His father has had 3 MI's - his father's first MI was at the age of 96 and he has had CABG x 2.  Patient is a former smoker, he quit 5 months ago. Patient also with risk factors of CAD including HTN and HLD, currently medication compliant with fenofibrate and ACE-I.   Also of note, Benjamin Blackburn says he really hasn't felt well in the past year, but the past 6 months has felt more fatigued and like he can't fully concentrate. He reports a history of sleep apnea, and has a CPAP machine and says often he is awakened gasping for breath. Also, tells me that frequently he has lower extremity edema.   Currently he denies chest pain, SOB and palpitations. He has a history of DVT and CT Angio chest ruled out PE, he did not have coronary calcifications on his chest CT. He has a documented history of a pulmonary nodule (0.4 cm left lung base) that was seen incidentally on CT imaging, but has had a CT in 2016 and this admission that does not mention any mass or abnormalities within his lungs.    Past Medical History   Past Medical History:  Diagnosis Date  . Adrenal abnormality (HCC)    mass on adrenal gland  . 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 E coli infection 03/2016   completed treatment  . Hiatal hernia   . Hypertension   . Lung mass   . Multiple gastric ulcers     Past Surgical History:  Procedure Laterality Date  . APPENDECTOMY    .  NASAL SEPTUM SURGERY    . TONSILLECTOMY       Allergies  Allergies  Allergen Reactions  . Amoxicillin Rash  . Latex Rash  . Penicillins Rash    Has patient had a PCN reaction causing immediate rash, facial/tongue/throat swelling, SOB or lightheadedness with hypotension: Yes Has patient had a PCN reaction causing severe rash involving mucus membranes or skin necrosis: No Has patient had a PCN reaction that required hospitalization No Has patient had a PCN reaction occurring within the last 10 years: No If all of the above answers are "NO", then may proceed with Cephalosporin use.   . Tape Rash  . Tetracyclines & Related Rash    Inpatient  Medications    . enoxaparin (LOVENOX) injection  40 mg Subcutaneous Q24H  . fenofibrate  54 mg Oral Daily  . lisinopril  10 mg Oral Daily   And  . hydrochlorothiazide  12.5 mg Oral Daily  . pantoprazole  40 mg Oral Daily  . rOPINIRole  1 mg Oral QHS  . sulfamethoxazole-trimethoprim  1 tablet Oral BID    Family History    No family history on file.  Social History    Social History   Social History  . Marital status: Single    Spouse name: N/A  . Number of children: N/A  . Years of education: N/A   Occupational History  . Not on file.   Social History Main Topics  . Smoking status: Former Research scientist (life sciences)  . Smokeless tobacco: Never Used  . Alcohol use No  . Drug use: No  . Sexual activity: Not on file   Other Topics Concern  . Not on file   Social History Narrative  . No narrative on file     Review of Systems    General:  No chills, fever, night sweats or weight changes.  Cardiovascular:  + chest pain, dyspnea on exertion, + edema, orthopnea, + palpitations, + paroxysmal nocturnal dyspnea. Dermatological: No rash, lesions/masses Respiratory: No cough, dyspnea Urologic: No hematuria, dysuria Abdominal:   No nausea, vomiting, + diarrhea, bright red blood per rectum, melena, or hematemesis Neurologic:  No visual changes, wkns, changes in mental status. All other systems reviewed and are otherwise negative except as noted above.  Physical Exam    Blood pressure (!) 133/59, pulse (!) 56, temperature 97.7 F (36.5 C), temperature source Oral, resp. rate 18, height 6\' 2"  (1.88 m), weight 264 lb 3.2 oz (119.8 kg), SpO2 98 %.  General: Pleasant, NAD Psych: Normal affect. Neuro: Alert and oriented X 3. Moves all extremities spontaneously. HEENT: Normal  Neck: Supple without bruits or JVD. Lungs:  Resp regular and unlabored, CTA. Heart: RRR no s3, s4, or murmurs. Abdomen: Soft, non-tender, non-distended, BS + x 4.  Extremities: No clubbing, cyanosis or edema.  DP/PT/Radials 2+ and equal bilaterally.   Labs    Troponin [facility] Test)  Recent Labs  05/08/16 1710  TROPIPOC 0.01    Recent Labs  05/08/16 2102 05/09/16 0148 05/09/16 0825  TROPONINI <0.03 <0.03 <0.03   Lab Results  Component Value Date   WBC 7.9 05/08/2016   HGB 15.7 05/08/2016   HCT 45.3 05/08/2016   MCV 87.5 05/08/2016   PLT 229 05/08/2016    Recent Labs Lab 05/02/16 1755 05/08/16 1335 05/08/16 2102  NA 136 137  --   K 4.0 4.2  --   CL 105 104  --   CO2 25 22  --   BUN 12 11  --  CREATININE 1.00 1.15 1.03  CALCIUM 9.1 9.6  --   PROT 7.2  --   --   BILITOT 0.5  --   --   ALKPHOS 52  --   --   ALT 66*  --   --   AST 32  --   --   GLUCOSE 91 122*  --    Lab Results  Component Value Date   CHOL 199 05/09/2016   HDL 32 (L) 05/09/2016   LDLCALC 95 05/09/2016   TRIG 362 (H) 05/09/2016   No results found for: [facility]   Radiology Studies    Dg Chest 2 View  Result Date: 05/08/2016 CLINICAL DATA:  Chest pain and shortness of breath beginning today. EXAM: CHEST  2 VIEW COMPARISON:  CT chest and PA and lateral chest 10/27/2015. FINDINGS: The lungs are clear. Heart size is normal. No pneumothorax or pleural effusion. No focal bony abnormality. IMPRESSION: Negative chest. Electronically Signed   By: Inge Rise M.D.   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 Multiplanar CT image reconstructions and MIPs were obtained  to evaluate the vascular anatomy. CONTRAST:  100 mL of Isovue 370 intravenous contrast COMPARISON:  Current chest radiograph.  Chest CTA, 10/27/2015 FINDINGS: Cardiovascular: No evidence of a pulmonary embolus. The great vessels normal caliber. No aortic dissection. No atherosclerotic plaque along the aorta. Heart is normal in size and configuration. No coronary artery calcifications. Mediastinum/Nodes: No enlarged mediastinal, hilar, or axillary lymph nodes. Thyroid gland, trachea, and esophagus demonstrate no significant findings. Lungs/Pleura: Lungs are clear. No pleural effusion or pneumothorax. Upper Abdomen: Fatty infiltration of the liver. 17 mm low-density left adrenal mass consistent with an adenoma no acute findings. Musculoskeletal: No chest wall abnormality. No acute or significant osseous findings. Review of the MIP images confirms the above findings. IMPRESSION: 1. No evidence of a pulmonary embolus. 2. No acute findings. 3. No vascular abnormality.  No coronary artery calcifications. Electronically Signed   By: Lajean Manes M.D.   On: 05/08/2016 17:32    EKG & Cardiac Imaging    EKG: NSR with non specific ST changes in anterolateral leads.   Echocardiogram: pending.   Assessment & Plan    1. Chest pain with strong family history of CAD: Presents with substernal chest pain that radiated to his left shoulder associated with nausea, diaphoresis and SOB. He describes daily angina with associated diaphoresis and has a family history of early onset MI. His father had his first MI at the age of 62.   Troponin negative x 3, EKG not concerning for active ischemia, but with some non specific changes. Also reassuring that CTA did not show any coronary artery calcification. However, patient would benefit from definitive left heart cath given significant risk for CAD.   Will also order echo to evaluate EF with his description of PND and edema.   2. HLD: LDL is 95, at goal (,100). Continue  fenofibrate. If he is  found to have CAD he will need high intensity statin. His A1c is pending.   3. HTN: On HCTZ and lisinopril 10mg  outpatient. Seems to be well controlled. Would favor adding chlorthalidone 25mg  daily and discontinuing HCTZ.    4. History of DVT: has previously been on coumadin, but not currently on any anticoagulation. His father also has history of DVT. Question whether there is a familial hypercoagulable disorder. He denies ever having any work for this.    Signed, Arbutus Leas, NP 05/09/2016, 12:00 PM Pager: [phone]  History and all data above reviewed.  Patient examined.  I agree with the findings as above.  The patient has chest pain that was severe and at rest.  No prior cardiac history but he does have a strong family history with his father having heart disease in his 53s.  No objective evidence of ischemia although his EKG had some non specific changes.  Pain did improve about 10 minutes after NTG.   The patient exam reveals COR:RRR, no rub  ,  Lungs: .clear  ,  Abd: Positive bowel sounds, no rebound no guarding, Ext No edema  .  All available labs, radiology testing, previous records reviewed. Agree with documented assessment and plan. CHEST PAIN:  Atypical and typical features in a patient with a strong family history of CAD.  Plan coronary CT angiography vs cardiac cath depending on the availability of either test.    Minus Breeding  1:38 PM  05/09/2016

## 2016-05-10 ENCOUNTER — Encounter (HOSPITAL_COMMUNITY): Payer: Self-pay | Admitting: Interventional Cardiology

## 2016-05-10 DIAGNOSIS — R079 Chest pain, unspecified: Secondary | ICD-10-CM | POA: Diagnosis not present

## 2016-05-10 DIAGNOSIS — M7989 Other specified soft tissue disorders: Secondary | ICD-10-CM | POA: Diagnosis not present

## 2016-05-10 LAB — BASIC METABOLIC PANEL
Anion gap: 9 (ref 5–15)
BUN: 9 mg/dL (ref 6–20)
CHLORIDE: 104 mmol/L (ref 101–111)
CO2: 23 mmol/L (ref 22–32)
Calcium: 9.2 mg/dL (ref 8.9–10.3)
Creatinine, Ser: 0.95 mg/dL (ref 0.61–1.24)
GFR calc Af Amer: >60 mL/min (ref >60)
GFR calc non Af Amer: >60 mL/min (ref >60)
Glucose, Bld: 90 mg/dL (ref 65–99)
POTASSIUM: 3.9 mmol/L (ref 3.5–5.1)
SODIUM: 136 mmol/L (ref 135–145)

## 2016-05-10 LAB — HEMOGLOBIN A1C
Hgb A1c MFr Bld: 5.1 % (ref 4.8–5.6)
MEAN PLASMA GLUCOSE: 100 mg/dL

## 2016-05-10 LAB — CBC
HEMATOCRIT: 46.5 % (ref 39.0–52.0)
HEMOGLOBIN: 15.6 g/dL (ref 13.0–17.0)
MCH: 29.4 pg (ref 26.0–34.0)
MCHC: 33.5 g/dL (ref 30.0–36.0)
MCV: 87.7 fL (ref 78.0–100.0)
Platelets: 249 10*3/uL (ref 150–400)
RBC: 5.3 MIL/uL (ref 4.22–5.81)
RDW: 12.3 % (ref 11.5–15.5)
WBC: 8.5 10*3/uL (ref 4.0–10.5)

## 2016-05-10 MED ORDER — CHLORTHALIDONE 25 MG PO TABS: 25.0000 mg | ORAL_TABLET | Freq: Every day | ORAL | 0 refills | Status: DC

## 2016-05-10 MED ORDER — TRAMADOL HCL 50 MG PO TABS
50.0000 mg | ORAL_TABLET | Freq: Four times a day (QID) | ORAL | Status: DC | PRN
  Administered 2016-05-10: 50 mg via ORAL
  Filled 2016-05-10: qty 1

## 2016-05-10 MED ORDER — LISINOPRIL 10 MG PO TABS: 10.0000 mg | ORAL_TABLET | Freq: Every day | ORAL | 0 refills | Status: DC

## 2016-05-10 MED ORDER — PANTOPRAZOLE SODIUM 40 MG PO TBEC: 40.0000 mg | DELAYED_RELEASE_TABLET | Freq: Every day | ORAL | 0 refills | Status: DC

## 2016-05-10 MED ORDER — NAPROXEN 250 MG PO TABS: 500.0000 mg | ORAL_TABLET | Freq: Two times a day (BID) | ORAL | Status: DC | PRN

## 2016-05-10 NOTE — Progress Notes (Signed)
Pt c/o 9/10 HA. Recently given tramadol. Not improved. MD paged. No new orders. Will continue to monitor.

## 2016-05-10 NOTE — Progress Notes (Signed)
Patient Name: Benjamin Blackburn Date of Encounter: 05/10/2016  Hospital Problem List     Active Problems:   Pain in the chest   Leg swelling    Patient Profile     Benjamin Blackburn is a 35 year old male with a past medical history of 2 cm adrenal adenoma for which he is followed by Endocrinology at [facility], HLD, HTN, ongoing tobacco abuse, chronic diarrhea, OSA non compliant with CPAP, obesity. He has a significant family history of CAD. Cardiology consulted for chest pain.    Subjective   No chest pain.  No SOB.   Inpatient Medications    . chlorthalidone  25 mg Oral Daily  . enoxaparin (LOVENOX) injection  40 mg Subcutaneous Q24H  . fenofibrate  54 mg Oral Daily  . lisinopril  10 mg Oral Daily  . pantoprazole  40 mg Oral Daily  . rOPINIRole  1 mg Oral QHS  . sodium chloride flush  3 mL Intravenous Q12H    Vital Signs    Vitals:   05/09/16 1830 05/09/16 1900 05/10/16 0459 05/10/16 0522  BP: 123/78 124/65 (!) 104/44 122/73  Pulse: 73 (!) 57 (!) 56 (!) 55  Resp:   20   Temp:   97.6 F (36.4 C)   TempSrc:   Oral   SpO2:   97%   Weight:      Height:        Intake/Output Summary (Last 24 hours) at 05/10/16 0945 Last data filed at 05/10/16 0900  Gross per 24 hour  Intake              360 ml  Output              300 ml  Net               60 ml   Filed Weights   05/08/16 2059  Weight: 264 lb 3.2 oz (119.8 kg)    Physical Exam    GEN: Well nourished, well developed, in  no acute distress.  Neck: Supple, no JVD, carotid bruits, or masses. Cardiac: RRR, no rubs, or gallops. No clubbing, cyanosis, no edema.  Radials/DP/PT 2+ and equal bilaterally.  Respiratory:  Respirations  regular and unlabored, clear to auscultation bilaterally. GI: Soft, nontender, nondistended, BS + x 4. Neuro:  Strength and sensation are intact.   Labs    CBC  Recent Labs  05/09/16 1438 05/10/16 0316  WBC 7.9 8.5  HGB 16.1 15.6  HCT 47.2 46.5  MCV 86.9 87.7  PLT 219 0000000   Basic  Metabolic Panel  Recent Labs  05/08/16 1335 05/08/16 2102 05/10/16 0316  NA 137  --  136  K 4.2  --  3.9  CL 104  --  104  CO2 22  --  23  GLUCOSE 122*  --  90  BUN 11  --  9  CREATININE 1.15 1.03 0.95  CALCIUM 9.6  --  9.2   Liver Function Tests No results for input(s): AST, ALT, ALKPHOS, BILITOT, PROT, ALBUMIN in the last 72 hours. No results for input(s): LIPASE, AMYLASE in the last 72 hours. Cardiac Enzymes  Recent Labs  05/08/16 2102 05/09/16 0148 05/09/16 0825  TROPONINI <0.03 <0.03 <0.03   BNP Invalid input(s): POCBNP D-Dimer No results for input(s): DDIMER in the last 72 hours. Hemoglobin A1C  Recent Labs  05/08/16 2102  HGBA1C 5.1   Fasting Lipid Panel  Recent Labs  05/09/16 0148  CHOL 199  HDL 32*  LDLCALC 95  TRIG 362*  CHOLHDL 6.2   Thyroid Function Tests  Recent Labs  05/09/16 0148  TSH 3.587     ECG    NA  Radiology    Dg Chest 2 View  Result Date: 05/08/2016 CLINICAL DATA:  Chest pain and shortness of breath beginning today. EXAM: CHEST  2 VIEW COMPARISON:  CT chest and PA and lateral chest 10/27/2015. FINDINGS: The lungs are clear. Heart size is normal. No pneumothorax or pleural effusion. No focal bony abnormality. IMPRESSION: Negative chest. Electronically Signed   By: Inge Rise M.D.   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 Multiplanar CT image reconstructions and MIPs were obtained to  evaluate the vascular anatomy. CONTRAST:  100 mL of Isovue 370 intravenous contrast COMPARISON:  Current chest radiograph.  Chest CTA, 10/27/2015 FINDINGS: Cardiovascular: No evidence of a pulmonary embolus. The great vessels normal caliber. No aortic dissection. No atherosclerotic plaque along the aorta. Heart is normal in size and configuration. No coronary artery calcifications. Mediastinum/Nodes: No enlarged mediastinal, hilar, or axillary lymph nodes. Thyroid gland, trachea, and esophagus demonstrate no significant findings. Lungs/Pleura: Lungs are clear. No pleural effusion or pneumothorax. Upper Abdomen: Fatty infiltration of the liver. 17 mm low-density left adrenal mass consistent with an adenoma no acute findings. Musculoskeletal: No chest wall abnormality. No acute or significant osseous findings. Review of the MIP images confirms the above findings. IMPRESSION: 1. No evidence of a pulmonary embolus. 2. No acute findings. 3. No vascular abnormality.  No coronary artery calcifications. Electronically Signed   By: Lajean Manes M.D.   On: 05/08/2016 17:32    Assessment & Plan    CHEST PAIN:  Cath without CAD.  Needs primary risk reduction.  No further cardiac work up.   No indication for echocardiogram.  Please call with further questions.   Signed, Minus Breeding, MD  05/10/2016, 9:45 AM

## 2016-05-10 NOTE — Progress Notes (Signed)
FPTS Interim Progress Note  S: Went to see patient in response to a page I received from his nurse. Patient complaining of severe headache, nausea and blurry vision. Headache is global.  His symptoms started after his LHC this afternoon. He denies numbness, tingling, focal weakness or dizziness.   O: BP (!) 104/44 (BP Location: Left Arm)   Pulse (!) 56   Temp 97.6 F (36.4 C) (Oral)   Resp 20   Ht 6\' 2"  (1.88 m)   Wt 264 lb 3.2 oz (119.8 kg)   SpO2 97%   BMI 33.92 kg/m   Neuro exam: Awake, alert and oriented appropriately, no aphasia or dysarthria. Cranial nerves II-XII within normal limits, motor 5 out of 5 in all muscle groups, sensation is intact in all dermatomes, no nystagmus, finger-to-nose within normal limits bilaterally.  A/P: Headache, nausea and blurry vision after LHC concerning for posterior circulation CVA. However, his neurologic exam is within normal limits.  -Treat headache with morphine 4 mg once. We'll add tramadol 50 mg when necessary. Avoid NSAID due to recent use of contrast for CTA and LHC. He also had history of epigastric pain and is on PPI -Treat nausea with Zofran as needed -Advised patient to alert [place] for any new neurologic symptoms -Low threshold for CT head or MRI if new neurologic finding  Mercy Riding, MD 05/10/2016, 5:15 AM PGY-2, [place] [facility] pager [phone]

## 2016-05-10 NOTE — Progress Notes (Signed)
Reviewed d/c instructions with pt. IV and tele removed. Will call when his ride arrives.

## 2016-05-10 NOTE — Discharge Instructions (Signed)
Mr. Redgate, you were admitted for chest pain and sweating concerning for acute coronary syndrome and possible heart attack.   Given your fathers history, we had a low threshold for work up. Cardiology was consulted and recommended you get a heart catheterization which did not show any coronary arterial disease with normal pumping action and no abnormal heart wall motion.   Your EKG and blood test did not indicate your chest pain was heart related.   We did start you on a new reflux medication called Protonix 40 mg daily considering your stomach ulcer. You also are to stop HCTZ and take a different medication called chlorthalidone 25 mg daily. Please continue your Lisinopril.  You will continue your fenofibrate and follow your endocrinologists advice to not take aspirin given your adrenal mass.   Cardiology did not feel you need follow up given your benign work up.   I have scheduled a PCP follow up at our clinic on 10/3. Please bring your medications.

## 2016-05-14 ENCOUNTER — Inpatient Hospital Stay: Payer: BLUE CROSS/BLUE SHIELD | Admitting: Family Medicine

## 2016-05-14 NOTE — Progress Notes (Deleted)
   Subjective:   Patient ID: Benjamin Blackburn    DOB: 12-17-1980, 35 y.o. male   MRN: [id number]  CC: ***  HPI: Benjamin Blackburn is a 35 y.o. male who presents to clinic today ***. Problems discussed today are as follows:  ROS: See HPI for pertinent ROS.  [place]: Pertinent past medical, surgical, family, and social history were reviewed and updated as appropriate. Smoking status reviewed.  Medications reviewed. Current Outpatient Prescriptions  Medication Sig Dispense Refill  . calcium carbonate (TUMS - DOSED IN MG ELEMENTAL CALCIUM) 500 MG chewable tablet Chew 1-2 tablets by mouth daily as needed for indigestion.    . chlorthalidone (HYGROTON) 25 MG tablet Take 1 tablet (25 mg total) by mouth daily. 30 tablet 0  . fenofibrate 54 MG tablet Take 54 mg by mouth daily.    Marland Kitchen lisinopril (PRINIVIL,ZESTRIL) 10 MG tablet Take 1 tablet (10 mg total) by mouth daily. 30 tablet 0  . pantoprazole (PROTONIX) 40 MG tablet Take 1 tablet (40 mg total) by mouth daily. 30 tablet 0  . rOPINIRole (REQUIP) 1 MG tablet Take 1 mg by mouth at bedtime.     No current facility-administered medications for this visit.     Objective:   There were no vitals taken for this visit. Vitals and nursing note reviewed.  General: well nourished, well developed, in no acute distress with non-toxic appearance HEENT: normocephalic, atraumatic, moist mucous membranes Neck: supple, non-tender without lymphadenopathy CV: regular rate and rhythm without murmurs, rubs, or gallops Lungs: clear to auscultation bilaterally with normal work of breathing Abdomen: soft, non-tender, non-distended, no masses or organomegaly palpable, normoactive bowel sounds Skin: warm, dry, no rashes or lesions, cap refill < 2 seconds Extremities: warm and well perfused, normal tone  Assessment & Plan:   No problem-specific Assessment & Plan notes found for this encounter.  No orders of the defined types were placed in this encounter.  No  orders of the defined types were placed in this encounter.   Harriet Butte, [place], PGY-1 05/14/2016 1:41 PM

## 2016-10-28 ENCOUNTER — Encounter (HOSPITAL_BASED_OUTPATIENT_CLINIC_OR_DEPARTMENT_OTHER): Payer: Self-pay | Admitting: Emergency Medicine

## 2016-10-28 ENCOUNTER — Emergency Department (HOSPITAL_BASED_OUTPATIENT_CLINIC_OR_DEPARTMENT_OTHER)
Admission: EM | Admit: 2016-10-28 | Discharge: 2016-10-28 | Disposition: A | Discharge status: Home / Self Care | Payer: BLUE CROSS/BLUE SHIELD | Attending: Emergency Medicine | Admitting: Emergency Medicine

## 2016-10-28 DIAGNOSIS — R197 Diarrhea, unspecified: Secondary | ICD-10-CM | POA: Diagnosis not present

## 2016-10-28 DIAGNOSIS — Z79899 Other long term (current) drug therapy: Secondary | ICD-10-CM | POA: Diagnosis not present

## 2016-10-28 DIAGNOSIS — R109 Unspecified abdominal pain: Secondary | ICD-10-CM

## 2016-10-28 DIAGNOSIS — I10 Essential (primary) hypertension: Secondary | ICD-10-CM | POA: Diagnosis not present

## 2016-10-28 DIAGNOSIS — Z87891 Personal history of nicotine dependence: Secondary | ICD-10-CM | POA: Diagnosis not present

## 2016-10-28 DIAGNOSIS — R112 Nausea with vomiting, unspecified: Secondary | ICD-10-CM | POA: Insufficient documentation

## 2016-10-28 DIAGNOSIS — R1030 Lower abdominal pain, unspecified: Secondary | ICD-10-CM | POA: Diagnosis present

## 2016-10-28 DIAGNOSIS — R1110 Vomiting, unspecified: Secondary | ICD-10-CM

## 2016-10-28 DIAGNOSIS — Z91040 Latex allergy status: Secondary | ICD-10-CM | POA: Diagnosis not present

## 2016-10-28 LAB — URINALYSIS, ROUTINE W REFLEX MICROSCOPIC
BILIRUBIN URINE: NEGATIVE
Glucose, UA: NEGATIVE mg/dL
HGB URINE DIPSTICK: NEGATIVE
KETONES UR: NEGATIVE mg/dL
Leukocytes, UA: NEGATIVE
Nitrite: NEGATIVE
PROTEIN: NEGATIVE mg/dL
Specific Gravity, Urine: 1.026 (ref 1.005–1.030)
pH: 6 (ref 5.0–8.0)

## 2016-10-28 LAB — COMPREHENSIVE METABOLIC PANEL
ALT: 76 U/L — AB (ref 17–63)
AST: 43 U/L — ABNORMAL HIGH (ref 15–41)
Albumin: 4 g/dL (ref 3.5–5.0)
Alkaline Phosphatase: 57 U/L (ref 38–126)
Anion gap: 7 (ref 5–15)
BUN: 12 mg/dL (ref 6–20)
CHLORIDE: 106 mmol/L (ref 101–111)
CO2: 25 mmol/L (ref 22–32)
CREATININE: 0.91 mg/dL (ref 0.61–1.24)
Calcium: 9 mg/dL (ref 8.9–10.3)
Glucose, Bld: 90 mg/dL (ref 65–99)
POTASSIUM: 3.7 mmol/L (ref 3.5–5.1)
Sodium: 138 mmol/L (ref 135–145)
Total Bilirubin: 0.5 mg/dL (ref 0.3–1.2)
Total Protein: 7.4 g/dL (ref 6.5–8.1)

## 2016-10-28 LAB — CBC WITH DIFFERENTIAL/PLATELET
Basophils Absolute: 0 10*3/uL (ref 0.0–0.1)
Basophils Relative: 0 %
EOS PCT: 2 %
Eosinophils Absolute: 0.2 10*3/uL (ref 0.0–0.7)
HCT: 44 % (ref 39.0–52.0)
Hemoglobin: 15.5 g/dL (ref 13.0–17.0)
LYMPHS ABS: 2.1 10*3/uL (ref 0.7–4.0)
LYMPHS PCT: 28 %
MCH: 29.9 pg (ref 26.0–34.0)
MCHC: 35.2 g/dL (ref 30.0–36.0)
MCV: 84.9 fL (ref 78.0–100.0)
MONO ABS: 0.8 10*3/uL (ref 0.1–1.0)
MONOS PCT: 10 %
Neutro Abs: 4.6 10*3/uL (ref 1.7–7.7)
Neutrophils Relative %: 60 %
PLATELETS: 229 10*3/uL (ref 150–400)
RBC: 5.18 MIL/uL (ref 4.22–5.81)
RDW: 12.4 % (ref 11.5–15.5)
WBC: 7.7 10*3/uL (ref 4.0–10.5)

## 2016-10-28 LAB — LIPASE, BLOOD: LIPASE: 20 U/L (ref 11–51)

## 2016-10-28 LAB — OCCULT BLOOD X 1 CARD TO LAB, STOOL: FECAL OCCULT BLD: NEGATIVE

## 2016-10-28 MED ORDER — DICYCLOMINE HCL 20 MG PO TABS: 20.0000 mg | ORAL_TABLET | Freq: Two times a day (BID) | ORAL | 0 refills | Status: DC

## 2016-10-28 MED ORDER — MORPHINE SULFATE (PF) 4 MG/ML IV SOLN
4.0000 mg | Freq: Once | INTRAVENOUS | Status: AC
Start: 2016-10-28 — End: 2016-10-28
  Administered 2016-10-28: 4 mg via INTRAVENOUS
  Filled 2016-10-28: qty 1

## 2016-10-28 MED ORDER — ONDANSETRON HCL 4 MG/2ML IJ SOLN
4.0000 mg | Freq: Once | INTRAMUSCULAR | Status: AC
  Administered 2016-10-28: 4 mg via INTRAVENOUS
  Filled 2016-10-28: qty 2

## 2016-10-28 MED ORDER — ONDANSETRON 4 MG PO TBDP: 4.0000 mg | ORAL_TABLET | Freq: Three times a day (TID) | ORAL | 0 refills | Status: AC | PRN

## 2016-10-28 NOTE — ED Provider Notes (Signed)
[facility] Provider Note   CSN: [id number] Arrival date & time: 10/28/16  0340     History   Chief Complaint Chief Complaint  Patient presents with  . Abdominal Pain    HPI Benjamin Blackburn is a 36 y.o. male.  HPI  This is a 36 year old male with a history of DVT, hiatal hernia, hypertension, peptic ulcers who presents with abdominal pain, vomiting, and diarrhea. Patient states that he began to not feel well on Thursday. He states that he thought he was coming down with a virus. He reports chills but no documented fevers. He states he felt somewhat better over the weekend but on Sunday developed worsening bilateral abdominal cramping. This is nonradiating. Currently at a 6 out of 10. He reports multiple episodes of nonbilious, nonbloody emesis. He also reports dark diarrhea stools. At baseline he states that he has loose stools. He has taken Pepto-Bismol with minimal relief. He reports that he was recently treated for an Escherichia coli infection by his GI doctor. He has completed treatment. Denies any fevers, urinary symptoms, chest pain, shortness of breath.  Past Medical History:  Diagnosis Date  . Adrenal abnormality (HCC)    mass on adrenal gland  . 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 E coli infection 03/2016   completed treatment  . Hiatal hernia   . Hypertension   . Lung mass   . Multiple gastric ulcers     Patient Active Problem List   Diagnosis Date Noted  . Leg swelling   . Pain in the chest 05/08/2016    Past Surgical History:  Procedure Laterality Date  . APPENDECTOMY    . CARDIAC CATHETERIZATION N/A 05/09/2016   Procedure: Left Heart Cath and Coronary Angiography;  Surgeon: Jettie Booze, MD;  Location: [facility];  Service: Cardiovascular;  Laterality: N/A;  . NASAL SEPTUM SURGERY    . TONSILLECTOMY         Home Medications    Prior to Admission medications   Medication Sig Start Date End Date Taking?  Authorizing Provider  calcium carbonate (TUMS - DOSED IN MG ELEMENTAL CALCIUM) 500 MG chewable tablet Chew 1-2 tablets by mouth daily as needed for indigestion.   Yes Historical Provider, MD  chlorthalidone (HYGROTON) 25 MG tablet Take 1 tablet (25 mg total) by mouth daily. 05/10/16  Yes Grayling Congress McMullen, DO  fenofibrate 54 MG tablet Take 54 mg by mouth daily.   Yes Historical Provider, MD  lisinopril (PRINIVIL,ZESTRIL) 10 MG tablet Take 1 tablet (10 mg total) by mouth daily. 05/10/16  Yes Grayling Congress McMullen, DO  pantoprazole (PROTONIX) 40 MG tablet Take 1 tablet (40 mg total) by mouth daily. 05/10/16  Yes Grayling Congress McMullen, DO  rOPINIRole (REQUIP) 1 MG tablet Take 1 mg by mouth at bedtime.   Yes Historical Provider, MD  dicyclomine (BENTYL) 20 MG tablet Take 1 tablet (20 mg total) by mouth 2 (two) times daily. 10/28/16   Merryl Hacker, MD  ondansetron (ZOFRAN ODT) 4 MG disintegrating tablet Take 1 tablet (4 mg total) by mouth every 8 (eight) hours as needed for nausea or vomiting. 10/28/16   Merryl Hacker, MD    Family History No family history on file.  Social History Social History  Substance Use Topics  . Smoking status: Former Smoker    Quit date: 11/29/2015  . Smokeless tobacco: Never Used  . Alcohol use No     Allergies  Amoxicillin; Latex; Penicillins; Tape; and Tetracyclines & related   Review of Systems Review of Systems  Constitutional: Positive for chills. Negative for fever.  Respiratory: Negative for shortness of breath.   Cardiovascular: Negative for chest pain.  Gastrointestinal: Positive for abdominal pain, diarrhea, nausea and vomiting.  Genitourinary: Negative for dysuria and hematuria.  All other systems reviewed and are negative.    Physical Exam Updated Vital Signs BP 131/79   Pulse 62   Temp 98.4 F (36.9 C) (Oral)   Resp 20   Ht 6\' 2"  (1.88 m)   Wt 264 lb (119.7 kg)   SpO2 96%   BMI 33.90 kg/m   Physical Exam  Constitutional: He is oriented  to person, place, and time. He appears well-developed and well-nourished. No distress.  HENT:  Head: Normocephalic and atraumatic.  Mucous membranes moist  Cardiovascular: Normal rate, regular rhythm and normal heart sounds.   No murmur heard. Pulmonary/Chest: Effort normal and breath sounds normal. No respiratory distress. He has no wheezes.  Abdominal: Soft. He exhibits no mass. There is no tenderness. There is no rebound.  Hyperactive bowel sounds, abdomen soft, no significant tenderness palpation  Genitourinary: Rectal exam shows guaiac negative stool.  Genitourinary Comments: Brown stool.  Musculoskeletal: He exhibits no edema.  Neurological: He is alert and oriented to person, place, and time.  Skin: Skin is warm and dry.  Psychiatric: He has a normal mood and affect.  Nursing note and vitals reviewed.    ED Treatments / Results  Labs (all labs ordered are listed, but only abnormal results are displayed) Labs Reviewed  COMPREHENSIVE METABOLIC PANEL - Abnormal; Notable for the following:       Result Value   AST 43 (*)    ALT 76 (*)    All other components within normal limits  CBC WITH DIFFERENTIAL/PLATELET  LIPASE, BLOOD  URINALYSIS, ROUTINE W REFLEX MICROSCOPIC  OCCULT BLOOD X 1 CARD TO LAB, STOOL  POC OCCULT BLOOD, ED    EKG  EKG Interpretation None       Radiology No results found.  Procedures Procedures (including critical care time)  Medications Ordered in ED Medications  morphine 4 MG/ML injection 4 mg (4 mg Intravenous Given 10/28/16 0423)  ondansetron (ZOFRAN) injection 4 mg (4 mg Intravenous Given 10/28/16 0423)     Initial Impression / Assessment and Plan / ED Course  I have reviewed the triage vital signs and the nursing notes.  Pertinent labs & imaging results that were available during my care of the patient were reviewed by me and considered in my medical decision making (see chart for details).     Patient presents with abdominal pain,  vomiting, diarrhea. Nontoxic. Vital signs reassuring. He appears hydrated. Abdominal exam is fairly benign. No point tenderness. He does have hyperactive bowel sounds. Rectal exam with brown stool and Hemoccult negative. Suspect her stools may be related to Pepto-Bismol use. Patient was given pain and nausea medication as well as fluids. Lab work obtained and largely reassuring. He does have a very minimal elevation in his LFTs. This has been present previously. On recheck, patient reports improvement of symptoms. Discussed with the patient given his benign exam, do not feel imaging would be helpful at this time. This could be a viral etiology. Abdominal exam continues to be benign. Patient was able to tolerate fluids. Supportive measures at home including Zofran and Bentyl. Follow up with GI specialist.  After history, exam, and medical workup I feel the patient has  been appropriately medically screened and is safe for discharge home. Pertinent diagnoses were discussed with the patient. Patient was given return precautions.   Final Clinical Impressions(s) / ED Diagnoses   Final diagnoses:  Abdominal pain, vomiting, and diarrhea    New Prescriptions New Prescriptions   DICYCLOMINE (BENTYL) 20 MG TABLET    Take 1 tablet (20 mg total) by mouth 2 (two) times daily.   ONDANSETRON (ZOFRAN ODT) 4 MG DISINTEGRATING TABLET    Take 1 tablet (4 mg total) by mouth every 8 (eight) hours as needed for nausea or vomiting.     Merryl Hacker, MD 10/28/16 [phone]

## 2016-10-28 NOTE — ED Triage Notes (Signed)
Pt states body aches & fever since Thursday, and lower abd pain that started last night with one episode of emesis. Also reports frequent watery black stools x 2 days.

## 2016-10-28 NOTE — Discharge Instructions (Signed)
You were seen today for abdominal pain, vomiting, and diarrhea. Your workup is largely reassuring. Your lab testing is largely unremarkable. This could be a viral illness. Symptom control at home. Try to stay as hydrated as possible. If symptoms worsen or you have any new symptoms, you need to be reevaluated. Follow-up with your GI doctor.

## 2017-12-18 ENCOUNTER — Emergency Department (HOSPITAL_COMMUNITY)
Admission: EM | Admit: 2017-12-18 | Discharge: 2017-12-18 | Disposition: A | Discharge status: Home / Self Care | Payer: 59 | Attending: Emergency Medicine | Admitting: Emergency Medicine

## 2017-12-18 ENCOUNTER — Emergency Department (HOSPITAL_COMMUNITY): Payer: 59

## 2017-12-18 ENCOUNTER — Encounter (HOSPITAL_COMMUNITY): Payer: Self-pay

## 2017-12-18 ENCOUNTER — Other Ambulatory Visit: Payer: Self-pay

## 2017-12-18 DIAGNOSIS — G459 Transient cerebral ischemic attack, unspecified: Secondary | ICD-10-CM | POA: Diagnosis not present

## 2017-12-18 DIAGNOSIS — Z79899 Other long term (current) drug therapy: Secondary | ICD-10-CM | POA: Insufficient documentation

## 2017-12-18 DIAGNOSIS — I161 Hypertensive emergency: Secondary | ICD-10-CM | POA: Diagnosis not present

## 2017-12-18 DIAGNOSIS — R079 Chest pain, unspecified: Secondary | ICD-10-CM | POA: Diagnosis present

## 2017-12-18 DIAGNOSIS — I10 Essential (primary) hypertension: Secondary | ICD-10-CM | POA: Insufficient documentation

## 2017-12-18 DIAGNOSIS — Z87891 Personal history of nicotine dependence: Secondary | ICD-10-CM | POA: Diagnosis not present

## 2017-12-18 DIAGNOSIS — Z91040 Latex allergy status: Secondary | ICD-10-CM | POA: Insufficient documentation

## 2017-12-18 LAB — CBC
HEMATOCRIT: 45 % (ref 39.0–52.0)
HEMOGLOBIN: 15.6 g/dL (ref 13.0–17.0)
MCH: 29.9 pg (ref 26.0–34.0)
MCHC: 34.7 g/dL (ref 30.0–36.0)
MCV: 86.4 fL (ref 78.0–100.0)
Platelets: 236 10*3/uL (ref 150–400)
RBC: 5.21 MIL/uL (ref 4.22–5.81)
RDW: 12.2 % (ref 11.5–15.5)
WBC: 9.1 10*3/uL (ref 4.0–10.5)

## 2017-12-18 LAB — PROTIME-INR
INR: 0.95
Prothrombin Time: 12.6 seconds (ref 11.4–15.2)

## 2017-12-18 LAB — I-STAT CHEM 8, ED
BUN: 13 mg/dL (ref 6–20)
CALCIUM ION: 1.14 mmol/L — AB (ref 1.15–1.40)
Chloride: 102 mmol/L (ref 101–111)
Creatinine, Ser: 1 mg/dL (ref 0.61–1.24)
GLUCOSE: 79 mg/dL (ref 65–99)
HCT: 45 % (ref 39.0–52.0)
Hemoglobin: 15.3 g/dL (ref 13.0–17.0)
Potassium: 3.7 mmol/L (ref 3.5–5.1)
Sodium: 139 mmol/L (ref 135–145)
TCO2: 23 mmol/L (ref 22–32)

## 2017-12-18 LAB — COMPREHENSIVE METABOLIC PANEL
ALK PHOS: 63 U/L (ref 38–126)
ALT: 57 U/L (ref 17–63)
ANION GAP: 9 (ref 5–15)
AST: 27 U/L (ref 15–41)
Albumin: 4.3 g/dL (ref 3.5–5.0)
BILIRUBIN TOTAL: 0.4 mg/dL (ref 0.3–1.2)
BUN: 12 mg/dL (ref 6–20)
CALCIUM: 9.5 mg/dL (ref 8.9–10.3)
CO2: 25 mmol/L (ref 22–32)
Chloride: 104 mmol/L (ref 101–111)
Creatinine, Ser: 1.09 mg/dL (ref 0.61–1.24)
GFR calc Af Amer: >60 mL/min (ref >60)
GFR calc non Af Amer: >60 mL/min (ref >60)
Glucose, Bld: 82 mg/dL (ref 65–99)
Potassium: 3.8 mmol/L (ref 3.5–5.1)
Sodium: 138 mmol/L (ref 135–145)
TOTAL PROTEIN: 7.4 g/dL (ref 6.5–8.1)

## 2017-12-18 LAB — DIFFERENTIAL
Basophils Absolute: 0 10*3/uL (ref 0.0–0.1)
Basophils Relative: 0 %
EOS PCT: 2 %
Eosinophils Absolute: 0.2 10*3/uL (ref 0.0–0.7)
LYMPHS ABS: 3.6 10*3/uL (ref 0.7–4.0)
LYMPHS PCT: 40 %
MONOS PCT: 6 %
Monocytes Absolute: 0.6 10*3/uL (ref 0.1–1.0)
NEUTROS PCT: 52 %
Neutro Abs: 4.7 10*3/uL (ref 1.7–7.7)

## 2017-12-18 LAB — I-STAT TROPONIN, ED: Troponin i, poc: 0 ng/mL (ref 0.00–0.08)

## 2017-12-18 LAB — APTT: aPTT: 31 seconds (ref 24–36)

## 2017-12-18 LAB — CBG MONITORING, ED: GLUCOSE-CAPILLARY: 114 mg/dL — AB (ref 65–99)

## 2017-12-18 NOTE — ED Notes (Signed)
Pt requesting to speak to provider. Roxanne Mins, MD at bedside.

## 2017-12-18 NOTE — ED Triage Notes (Addendum)
Per [facility], pt arrives from home with c/o sudden onset of chest pain. Pt reports symptoms began at midnight. Pt endorses left sided face, arm, and leg numbness and difficulty swallowing. Pt received 324 ASA from EMS, pt refused NTG.

## 2017-12-18 NOTE — ED Notes (Signed)
Pt denies being claustrophobic. Pt updated on plan for MRI.

## 2017-12-18 NOTE — Code Documentation (Signed)
37 yo code stroke from Home via [facility] with left sided numbness and Left arm drift (per EMS).  He states that he was watching television when he had a sharp pain in his chest and noted numbness in his left arm and into the left side of his face and left leg.  Dr. Roxanne Mins and Dr. Lorraine Lax at bedside.  NIHSS 2. CT done.  No acute treatment- No TPA.  MRI ordered.

## 2017-12-18 NOTE — Consult Note (Addendum)
Requesting Physician: Dr. Roxanne Mins    Chief Complaint: Left side tingling, left leg weakness  History obtained from: Patient and Chart     HPI:                                                                                                                                       Benjamin Blackburn is an 37 y.o. male with past medical history of DVT, hypertension,peptic ulcers who presents with sudden onset sharp chest pain while watching TV followed by left side tingling sensation and mild left-sided weakness. Complaints of a sharp pain down his left leg as well as tingling of his left face arm and leg.He also feels like his throat is stuck. He is recently started on Bactrim and took a dose few hours ago.  The pressure was elevated around 786 systolic per EMS. He is not compliant with BP meds.     Date last known well: 5.9.19 Time last known well: midnight tPA Given: no, mild symptoms NIHSS: 2 Baseline MRS 0    Past Medical History:  Diagnosis Date  . Adrenal abnormality (HCC)    mass on adrenal gland  . 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 E coli infection 03/2016   completed treatment  . Hiatal hernia   . Hypertension   . Lung mass   . Multiple gastric ulcers     Past Surgical History:  Procedure Laterality Date  . APPENDECTOMY    . CARDIAC CATHETERIZATION N/A 05/09/2016   Procedure: Left Heart Cath and Coronary Angiography;  Surgeon: Jettie Booze, MD;  Location: [facility];  Service: Cardiovascular;  Laterality: N/A;  . NASAL SEPTUM SURGERY    . TONSILLECTOMY      History reviewed. No pertinent family history. Social History:  reports that he quit smoking about 2 years ago. He has never used smokeless tobacco. He reports that he does not drink alcohol or use drugs.  Allergies:  Allergies  Allergen Reactions  . Amoxicillin Rash  . Latex Rash  . Penicillins Rash    Has patient had a PCN reaction causing immediate rash, facial/tongue/throat swelling,  SOB or lightheadedness with hypotension: Yes Has patient had a PCN reaction causing severe rash involving mucus membranes or skin necrosis: No Has patient had a PCN reaction that required hospitalization No Has patient had a PCN reaction occurring within the last 10 years: No If all of the above answers are "NO", then may proceed with Cephalosporin use.   . Tape Rash  . Tetracyclines & Related Rash    Medications:  I reviewed home medications   ROS:                                                                                                                                     14 systems reviewed and negative except above    Examination:                                                                                                      General: Appears well-developed and well-nourished.  Psych: Affect appropriate to situation Eyes: No scleral injection HENT: No OP obstrucion Head: Normocephalic.  Cardiovascular: Normal rate and regular rhythm.  Respiratory: Effort normal and breath sounds normal to anterior ascultation GI: Soft.  No distension. There is no tenderness.  Skin: WDI   Neurological Examination Mental Status: Alert, oriented, thought content appropriate.  Speech fluent without evidence of aphasia. Able to follow 3 step commands without difficulty. Cranial Nerves: II: Visual fields grossly normal,  III,IV, VI: ptosis not present, extra-ocular motions intact bilaterally, pupils equal, round, reactive to light and accommodation V,VII: smile symmetric, facial light touch sensation normal bilaterally VIII: hearing normal bilaterally IX,X: uvula rises symmetrically XI: bilateral shoulder shrug XII: midline tongue extension Motor: Right : Upper extremity   5/5    Left:     Upper extremity   5/5  Lower extremity   5/5     Lower extremity   4+/5 Tone  and bulk:normal tone throughout; no atrophy noted Sensory: Pinprick and light touch intact throughout, bilaterally Deep Tendon Reflexes: 2+ and symmetric throughout Plantars: Right: downgoing   Left: downgoing Cerebellar: normal finger-to-nose, normal rapid alternating movements and normal heel-to-shin test Gait: normal gait and station     Lab Results: Basic Metabolic Panel: Recent Labs  Lab 12/18/17 0121 12/18/17 0122  NA 138 139  K 3.8 3.7  CL 104 102  CO2 25  --   GLUCOSE 82 79  BUN 12 13  CREATININE 1.09 1.00  CALCIUM 9.5  --     CBC: Recent Labs  Lab 12/18/17 0121 12/18/17 0122  WBC 9.1  --   NEUTROABS 4.7  --   HGB 15.6 15.3  HCT 45.0 45.0  MCV 86.4  --   PLT 236  --     Coagulation Studies: Recent Labs    12/18/17 0121  LABPROT 12.6  INR 0.95    Imaging: Ct Head Code Stroke Wo Contrast  Result Date: 12/18/2017 CLINICAL DATA:  Code stroke. Initial evaluation for acute left-sided  deficits. EXAM: CT HEAD WITHOUT CONTRAST TECHNIQUE: Contiguous axial images were obtained from the base of the skull through the vertex without intravenous contrast. COMPARISON:  None. FINDINGS: Brain: Cerebral volume within normal limits. No acute intracranial hemorrhage. No acute large vessel territory infarct. No mass lesion, midline shift or mass effect. No hydrocephalus. No extra-axial fluid collection. Vascular: No asymmetric hyperdense vessel. Skull: Scalp soft tissues and calvarium within normal limits. Sinuses/Orbits: Globes and orbital soft tissues normal. Paranasal sinuses and mastoid air cells are clear. Other: None. ASPECTS [facility] Stroke Program Early CT Score) - Ganglionic level infarction (caudate, lentiform nuclei, internal capsule, insula, M1-M3 cortex): 7 - Supraganglionic infarction (M4-M6 cortex): 3 Total score (0-10 with 10 being normal): 10 IMPRESSION: 1. Negative head CT.  No acute intracranial abnormality. 2. ASPECTS is 10. These results were communicated to  Cambrea Kirt at 1:36 amon 5/9/2019by text page via the [facility] messaging system. Electronically Signed   By: Jeannine Boga M.D.   On: 12/18/2017 01:38     ASSESSMENT AND PLAN  37 y.o. male with past medical history of DVT, hypertension,peptic ulcers who presents with sudden onset sharp chest pain while watching TV followed by left side tingling sensation and mild left-sided weakness. Ct head was unremarkable.   HTN emergency   Recommendations - MRI Brain to rule out stroke  - If negative, recommend BP control    #Reviewed MRI brain - negative for acute stroke   Angel Weedon [facility] Pager Number [phone]

## 2017-12-18 NOTE — ED Notes (Signed)
Roxanne Mins, MD at bedside. Phlebotomy at bedside. Stroke, RN at bedside. Aroor, MD at bedside.

## 2017-12-18 NOTE — ED Provider Notes (Signed)
[facility] Provider Note   CSN: [id number] Arrival date & time: 12/18/17  0114   An emergency department physician performed an initial assessment on this suspected stroke patient at 0114.  History   Chief Complaint Chief Complaint  Patient presents with  . Code Stroke    HPI Benjamin Blackburn is a 37 y.o. male.  The history is provided by the patient.  He has history of hypertension, DVT and states that he was watching television when he had a sharp pain in his chest and noted numbness in his left arm and into the left side of his face and left leg.  EMS was called and noted left arm drift, and he was transported as a code stroke.  His chest pain is no longer present.  He denies dyspnea or nausea.  He still feels some tingling on the left side of his body.  Past Medical History:  Diagnosis Date  . Adrenal abnormality (HCC)    mass on adrenal gland  . 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 E coli infection 03/2016   completed treatment  . Hiatal hernia   . Hypertension   . Lung mass   . Multiple gastric ulcers     Patient Active Problem List   Diagnosis Date Noted  . Leg swelling   . Pain in the chest 05/08/2016    Past Surgical History:  Procedure Laterality Date  . APPENDECTOMY    . CARDIAC CATHETERIZATION N/A 05/09/2016   Procedure: Left Heart Cath and Coronary Angiography;  Surgeon: Jettie Booze, MD;  Location: [facility];  Service: Cardiovascular;  Laterality: N/A;  . NASAL SEPTUM SURGERY    . TONSILLECTOMY          Home Medications    Prior to Admission medications   Medication Sig Start Date End Date Taking? Authorizing Provider  calcium carbonate (TUMS - DOSED IN MG ELEMENTAL CALCIUM) 500 MG chewable tablet Chew 1-2 tablets by mouth daily as needed for indigestion.    [provider]  chlorthalidone (HYGROTON) 25 MG tablet Take 1 tablet (25 mg total) by mouth daily. 05/10/16   [place] Bing, DO  dicyclomine (BENTYL) 20 MG tablet Take 1 tablet (20 mg total) by mouth 2 (two) times daily. 10/28/16   Horton, Barbette Hair, MD  fenofibrate 54 MG tablet Take 54 mg by mouth daily.    [provider]  lisinopril (PRINIVIL,ZESTRIL) 10 MG tablet Take 1 tablet (10 mg total) by mouth daily. 05/10/16   [place] Bing, DO  ondansetron (ZOFRAN ODT) 4 MG disintegrating tablet Take 1 tablet (4 mg total) by mouth every 8 (eight) hours as needed for nausea or vomiting. 10/28/16   Horton, Barbette Hair, MD  pantoprazole (PROTONIX) 40 MG tablet Take 1 tablet (40 mg total) by mouth daily. 05/10/16   [place] Bing, DO  rOPINIRole (REQUIP) 1 MG tablet Take 1 mg by mouth at bedtime.    [provider]    Family History History reviewed. No pertinent family history.  Social History Social History   Tobacco Use  . Smoking status: Former Smoker    Last attempt to quit: 11/29/2015    Years since quitting: 2.0  . Smokeless tobacco: Never Used  Substance Use Topics  . Alcohol use: No  . Drug use: No     Allergies   Amoxicillin; Latex; Penicillins; Tape; and Tetracyclines & related   Review of  Systems Review of Systems  All other systems reviewed and are negative.    Physical Exam Updated Vital Signs BP 140/83   Pulse 60   Temp 97.8 F (36.6 C)   Resp 19   Ht 6\' 2"  (1.88 m)   Wt 124.4 kg (274 lb 4 oz)   SpO2 98%   BMI 35.21 kg/m   Physical Exam  Nursing note and vitals reviewed.  37 year old male, resting comfortably and in no acute distress. Vital signs are significant for borderline elevated blood pressure. Oxygen saturation is 98%, which is normal. Head is normocephalic and atraumatic. PERRLA, EOMI. Oropharynx is clear. Neck is nontender and supple without adenopathy or JVD. Back is nontender and there is no CVA tenderness. Lungs are clear without rales, wheezes, or rhonchi. Chest is nontender. Heart has regular rate and rhythm without murmur. Abdomen  is soft, flat, nontender without masses or hepatosplenomegaly and peristalsis is normoactive. Extremities have no cyanosis or edema, full range of motion is present. Skin is warm and dry without rash. Neurologic: Mental status is normal, cranial nerves are intact.  There is slight left pronator drift, but objective testing of muscle strength appears symmetric.  There is slight weakness of flexion of the left hip compared with the right.  Slight decreased pinprick sensation of the left hand compared with the right, but with equal sensation in the face and leg.  ED Treatments / Results  Labs (all labs ordered are listed, but only abnormal results are displayed) Labs Reviewed  CBG MONITORING, ED - Abnormal; Notable for the following components:      Result Value   Glucose-Capillary 114 (*)    All other components within normal limits  I-STAT CHEM 8, ED - Abnormal; Notable for the following components:   Calcium, Ion 1.14 (*)    All other components within normal limits  PROTIME-INR  APTT  CBC  DIFFERENTIAL  COMPREHENSIVE METABOLIC PANEL  I-STAT TROPONIN, ED    EKG EKG Interpretation  Date/Time:  Thursday Dec 18 2017 01:31:57 EDT Ventricular Rate:  60 PR Interval:    QRS Duration: 96 QT Interval:  395 QTC Calculation: 395 R Axis:   51 Text Interpretation:  Sinus rhythm Anterior infarct, old When compared with ECG of 05/09/2016, No significant change was found Confirmed by Delora Fuel ([id number]) on 12/18/2017 2:16:12 AM   Radiology Ct Head Code Stroke Wo Contrast  Result Date: 12/18/2017 CLINICAL DATA:  Code stroke. Initial evaluation for acute left-sided deficits. EXAM: CT HEAD WITHOUT CONTRAST TECHNIQUE: Contiguous axial images were obtained from the base of the skull through the vertex without intravenous contrast. COMPARISON:  None. FINDINGS: Brain: Cerebral volume within normal limits. No acute intracranial hemorrhage. No acute large vessel territory infarct. No mass lesion, midline  shift or mass effect. No hydrocephalus. No extra-axial fluid collection. Vascular: No asymmetric hyperdense vessel. Skull: Scalp soft tissues and calvarium within normal limits. Sinuses/Orbits: Globes and orbital soft tissues normal. Paranasal sinuses and mastoid air cells are clear. Other: None. ASPECTS [facility] Stroke Program Early CT Score) - Ganglionic level infarction (caudate, lentiform nuclei, internal capsule, insula, M1-M3 cortex): 7 - Supraganglionic infarction (M4-M6 cortex): 3 Total score (0-10 with 10 being normal): 10 IMPRESSION: 1. Negative head CT.  No acute intracranial abnormality. 2. ASPECTS is 10. These results were communicated to [place] at 1:36 amon 5/9/2019by text page via the [facility] messaging system. Electronically Signed   By: Jeannine Boga M.D.   On: 12/18/2017 01:38    Procedures Procedures  CRITICAL CARE Performed by: Delora Fuel Total critical care time: 40 minutes Critical care time was exclusive of separately billable procedures and treating other patients. Critical care was necessary to treat or prevent imminent or life-threatening deterioration. Critical care was time spent personally by me on the following activities: development of treatment plan with patient and/or surrogate as well as nursing, discussions with consultants, evaluation of patient's response to treatment, examination of patient, obtaining history from patient or surrogate, ordering and performing treatments and interventions, ordering and review of laboratory studies, ordering and review of radiographic studies, pulse oximetry and re-evaluation of patient's condition.  Medications Ordered in ED Medications - No data to display   Initial Impression / Assessment and Plan / ED Course  I have reviewed the triage vital signs and the nursing notes.  Pertinent labs & imaging results that were available during my care of the patient were reviewed by me and considered in my medical decision making  (see chart for details).  Left-sided numbness with pronator drift concerning for stroke.  Code stroke has been activated and he is sent for emergent CT scan.  Patient is seen in conjunction with Dr. Lorraine Lax of neurology service.  Old records are reviewed, and he had a cardiac catheterization in September 2017 showing no coronary artery disease.  3:08 AM CT is negative.  He continues to have some tingling in his left upper arm and face.  However, pronator drift is no longer present.  He will be sent for MRI of the brain.  6:59 AM MRI shows no evidence of acute stroke.  He is back to normal neurologically.  Recommendation was made for admission to complete TIA work-up.  Patient states he prefers to have the work-up done as an outpatient.  I have explained to him that it is much more efficient to have the testing done as part of hospital admission, but it is medically acceptable to have the testing done as an outpatient.  He is advised to take aspirin daily and to see his primary care physician to arrange MRA of the head, carotid duplex scan, and echocardiogram.  Return precautions discussed.  Final Clinical Impressions(s) / ED Diagnoses   Final diagnoses:  TIA (transient ischemic attack)    ED Discharge Orders    None       Delora Fuel, MD 95/63/87 [phone]

## 2017-12-18 NOTE — ED Notes (Signed)
ED Provider at bedside. 

## 2017-12-18 NOTE — ED Notes (Signed)
Patient transported to MRI 

## 2017-12-18 NOTE — Discharge Instructions (Addendum)
Take one 325 mg aspirin tablet every day.  See your doctor to set up the rest of the tests needed to evaluate transient ischemic attack. Those tests are MRA of the head, carotid duplex scan, and echocardiogram.  Return if you are having any new symptoms.

## 2017-12-24 ENCOUNTER — Ambulatory Visit: Payer: Self-pay | Admitting: Neurology

## 2018-01-07 ENCOUNTER — Telehealth: Payer: Self-pay | Admitting: Neurology

## 2018-01-07 ENCOUNTER — Ambulatory Visit: Payer: 59 | Admitting: Neurology

## 2018-01-07 ENCOUNTER — Encounter: Payer: Self-pay | Admitting: Neurology

## 2018-01-07 VITALS — BP 138/79 | HR 86 | Ht 74.0 in | Wt 270.8 lb

## 2018-01-07 DIAGNOSIS — R079 Chest pain, unspecified: Secondary | ICD-10-CM

## 2018-01-07 DIAGNOSIS — R202 Paresthesia of skin: Secondary | ICD-10-CM | POA: Diagnosis not present

## 2018-01-07 DIAGNOSIS — G459 Transient cerebral ischemic attack, unspecified: Secondary | ICD-10-CM | POA: Diagnosis not present

## 2018-01-07 NOTE — Telephone Encounter (Signed)
Patient needs a 2 month follow up from 5/29 appointment. Patient said that he has a difficult work schedule and would prefer to call to schedule this appointment

## 2018-01-07 NOTE — Patient Instructions (Addendum)
-   continue ASA 325mg  and lipitor for stroke prevention - will do CTA head and neck - will do TCD bubble study.  - will draw blood to rule out thickening of the blood - Follow up with your primary care physician for stroke risk factor modification. Recommend maintain blood pressure goal <130/80, diabetes with hemoglobin A1c goal below 7.0% and lipids with LDL cholesterol goal below 70 mg/dL.  - follow up with vascular surgeon, and cardiology - relaxation and de-stress yourself - follow up in 2 months with Benjamin Blackburn.

## 2018-01-07 NOTE — Telephone Encounter (Signed)
[employer] order sent to GI. They obtain the auth and will reach out to the pt to schedule.  °

## 2018-01-08 ENCOUNTER — Telehealth: Payer: Self-pay

## 2018-01-08 DIAGNOSIS — R202 Paresthesia of skin: Secondary | ICD-10-CM | POA: Insufficient documentation

## 2018-01-08 DIAGNOSIS — G459 Transient cerebral ischemic attack, unspecified: Secondary | ICD-10-CM | POA: Insufficient documentation

## 2018-01-08 NOTE — Telephone Encounter (Signed)
RN call patients PCP office. Pt does see Sindy Messing Podraza for his routine medical help check up. Fax number is [phone]. Dr.Xu request notes be fax to PCP. Office notes were fax at [phone]. Fax confirmed and receive.

## 2018-01-08 NOTE — Progress Notes (Signed)
NEUROLOGY CLINIC NEW PATIENT NOTE  NAME: Benjamin Blackburn DOB: [birth year] REFERRING PHYSICIAN: Keane Scrape*  I saw Benjamin Blackburn as a new consult in the neurovascular clinic today regarding  Chief Complaint  Patient presents with  . Referral    Pt was seen in hospital at [facility] ED on 12/18/2017 in the ED for code stroke. Pt was not seen by any of our MD. Pt is  with dad Benjamin Blackburn  .  HPI: Benjamin Blackburn is a 37 y.o. male with PMH of hiatal hernia, stable adrenal mass, colon polyps s/p resection, LE venous insufficiency who presents as a new patient for episode of chest pain and left sided numbness.   Pt stated that he was sitting in couch watching TV on 12/18/17 and had sudden onset sharp chest pain radiating to left jaw, left arm, but also had left side face tingling and left arm tingling spreading to left leg. EMS called. When he walked to the ambulance, he had right hand tremor and EMS concerning for left arm pronator drift EKG unremarkable. In ED, his chest pain resolved in one hour and left sided numbness tingling resolved in 4-5 hours. CT and MRI negative. His BP in ED was in 170s. CBC and BMP were normal. Neurology was consulted and considered hypertensive urgency. Pt discharged with ASA and PCP close follow up.   Pt also had atypical chest pain in 2017 and had cardiac work up including cardiac cath showed no CAD. On follow up with PCP, continued ASA and started on lipitor. Ordered CUS and TTE. CUS done on 12/25/17 showed right ICA 40-59% stenosis. Will have TTE today. He was then refer to cardiology and vascular surgery. On today's visit, pt denies any recurrent stroke like symptoms. However, he continue to complain chronic fatigue and intermittent lightheadedness on walking. No dizziness or lightheadedness on sitting down.   Pt had been following with vascular surgery Dr. Laurance Flatten in 2014 for leg swelling and leg pain, LE venous doppler showed no DVT, dx of venous  insufficiency and treated with compression socks. His father and uncle have hx of DVT and LE venous stasis.   He also had followed with [facility] for hypersomnolence in 2014 and home and onsite sleep study in 2014 were both negative although pt clinical complains were severe.   He also had extensive GI follow up from 06/2013 to 12/2016 with for nausea vomiting, epigastric pain, chronic diarrhea. Had extensive work up including EGD/colonoscopy which were unremarkable except 2 sessile polyps which were removed. CTE showed no evidence of IBS but diffuse hepatic steatosis and stable left adrenal adenoma. Was given cholestyramine for treatment. And instructed to repeat colonoscopy in 12/2021.   He also followed with endocrinology for left 2cm adrenal adenoma. Had hormonal evaluation. Aldosterone and renin were normal. Serum metanephrines were normal. Dexamethasone suppression test was normal. A 24-hour urine free cortisol was elevated at 87.8 (normal 4-50) but with elevated 24 hour urine creatinine 2.71 (0.63-2.5). A repeat CT scan adrenal protocol demonstrates more than 60% washout suggestive of a lipid poor adenoma with Hounsfield units of 25 pre-contrast. More consistent with benign adenoma.  He has quit smoking in 03/2014. Denies illicit drug use. 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    Past Medical History:  Diagnosis Date  . Adrenal abnormality (HCC)    mass on adrenal gland  . 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 E coli infection 03/2016  completed treatment  . Hiatal hernia   . Hypertension   . Lung mass   . Multiple gastric ulcers    Past Surgical History:  Procedure Laterality Date  . APPENDECTOMY    . CARDIAC CATHETERIZATION N/A 05/09/2016   Procedure: Left Heart Cath and Coronary Angiography;  Surgeon: Jettie Booze, MD;  Location: [facility];  Service: Cardiovascular;  Laterality: N/A;  . NASAL SEPTUM SURGERY    . TONSILLECTOMY     History  reviewed. No pertinent family history. Current Outpatient Medications  Medication Sig Dispense Refill  . aspirin 325 MG tablet Take 325 mg by mouth daily.    Marland Kitchen atorvastatin (LIPITOR) 20 MG tablet Take 20 mg by mouth daily.    . calcium carbonate (TUMS - DOSED IN MG ELEMENTAL CALCIUM) 500 MG chewable tablet Chew 1-2 tablets by mouth daily as needed for indigestion.    Marland Kitchen ibuprofen (ADVIL,MOTRIN) 200 MG tablet Take 200-800 mg by mouth every 6 (six) hours as needed for moderate pain.    . NONFORMULARY OR COMPOUNDED ITEM Apply 1 application topically as needed. For yeast infection     . ondansetron (ZOFRAN ODT) 4 MG disintegrating tablet Take 1 tablet (4 mg total) by mouth every 8 (eight) hours as needed for nausea or vomiting. 20 tablet 0  . calcium carbonate (TUMS - DOSED IN MG ELEMENTAL CALCIUM) 500 MG chewable tablet Chew by mouth.     No current facility-administered medications for this visit.    Allergies  Allergen Reactions  . Amoxicillin Rash  . Latex Rash  . Penicillins Rash    Has patient had a PCN reaction causing immediate rash, facial/tongue/throat swelling, SOB or lightheadedness with hypotension: Yes Has patient had a PCN reaction causing severe rash involving mucus membranes or skin necrosis: No Has patient had a PCN reaction that required hospitalization No Has patient had a PCN reaction occurring within the last 10 years: No If all of the above answers are "NO", then may proceed with Cephalosporin use.   . Tape Rash  . Tetracyclines & Related Rash   Social History   Socioeconomic History  . Marital status: Single    Spouse name: Not on file  . Number of children: Not on file  . Years of education: Not on file  . Highest education level: Not on file  Occupational History  . Not on file  Social Needs  . Financial resource strain: Not on file  . Food insecurity:    Worry: Not on file    Inability: Not on file  . Transportation needs:    Medical: Not on file     Non-medical: Not on file  Tobacco Use  . Smoking status: Former Smoker    Last attempt to quit: 11/29/2015    Years since quitting: 2.1  . Smokeless tobacco: Never Used  Substance and Sexual Activity  . Alcohol use: No  . Drug use: No  . Sexual activity: Not on file  Lifestyle  . Physical activity:    Days per week: Not on file    Minutes per session: Not on file  . Stress: Not on file  Relationships  . Social connections:    Talks on phone: Not on file    Gets together: Not on file    Attends religious service: Not on file    Active member of club or organization: Not on file    Attends meetings of clubs or organizations: Not on file    Relationship  status: Not on file  . Intimate partner violence:    Fear of current or ex partner: Not on file    Emotionally abused: Not on file    Physically abused: Not on file    Forced sexual activity: Not on file  Other Topics Concern  . Not on file  Social History Narrative  . Not on file    Review of Systems Full 14 system review of systems performed and notable only for those listed, all others are neg:  Constitutional:  fatigue Cardiovascular: chest pain, palpitations, swelling in legs Ear/Nose/Throat:  Ringing in ears Skin: moles Eyes:  Blurred vision, eye pain Respiratory:  snoring Gastroitestinal:   Genitourinary:  Hematology/Lymphatic:   Endocrine: increased thirst Musculoskeletal:   Allergy/Immunology:  Skin sensitivity Neurological:  HA, numbness, dizziness Psychiatric: decreased energy Sleep: sleepiness, snoring, restless legs   Physical Exam  Vitals:   01/07/18 0834  BP: 138/79  Pulse: 86    General - Well nourished, well developed, depressed mood.  Ophthalmologic - Sharp disc margins OU.  Cardiovascular - Regular rate and rhythm with no murmur. Carotid pulses were 2+ without bruits .   Neck - supple, no nuchal rigidity .  Mental Status -  Level of arousal and orientation to time, place, and person  were intact. Language including expression, naming, repetition, comprehension, reading, and writing was assessed and found intact. Attention span and concentration were normal. Fund of Knowledge was assessed and was intact.  Cranial Nerves II - XII - II - Visual field intact OU. III, IV, VI - Extraocular movements intact. V - Facial sensation intact bilaterally. VII - Facial movement intact bilaterally. VIII - Hearing & vestibular intact bilaterally. X - Palate elevates symmetrically. XI - Chin turning & shoulder shrug intact bilaterally. XII - Tongue protrusion intact.  Motor Strength - The patient's strength was normal in all extremities and pronator drift was absent.  Bulk was normal and fasciculations were absent.   Motor Tone - Muscle tone was assessed at the neck and appendages and was normal.  Reflexes - The patient's reflexes were normal in all extremities and he had no pathological reflexes.  Sensory - Light touch, temperature/pinprick, vibration and proprioception, and Romberg testing were assessed and were normal.    Coordination - The patient had normal movements in the hands and feet with no ataxia or dysmetria.  Tremor was absent.  Gait and Station - The patient's transfers, posture, gait, station, and turns were observed as normal.   Imaging  I have personally reviewed the radiological images below and agree with the radiology interpretations.  Mr Brain Wo Contrast  Result Date: 12/18/2017 CLINICAL DATA:  Initial evaluation for possible stroke. EXAM: MRI HEAD WITHOUT CONTRAST TECHNIQUE: Multiplanar, multiecho pulse sequences of the brain and surrounding structures were obtained without intravenous contrast. COMPARISON:  Prior CT from earlier the same day. FINDINGS: Brain: Cerebral volume normal. No focal parenchymal signal abnormality. No abnormal foci of restricted diffusion to suggest acute or subacute ischemia. Gray-white matter differentiation maintained. No  evidence for chronic infarction. No acute or chronic intracranial hemorrhage. No mass lesion, midline shift or mass effect. No hydrocephalus. No extra-axial fluid collection. Major dural sinuses are patent. Pituitary gland suprasellar region normal. Midline structures intact and normal. Vascular: Major intracranial vascular flow voids are well maintained. Skull and upper cervical spine: Craniocervical junction normal. Upper cervical spine normal. Bone marrow signal intensity within normal limits. No scalp soft tissue abnormality. Sinuses/Orbits: Globes and orbital soft tissues within normal limits.  Paranasal sinuses and mastoid air cells are clear. Inner ear structures grossly normal. Other: None. IMPRESSION: Normal brain MRI.  No acute intracranial abnormality identified. Electronically Signed   By: Jeannine Boga M.D.   On: 12/18/2017 06:35   Ct Head Code Stroke Wo Contrast  Result Date: 12/18/2017 CLINICAL DATA:  Code stroke. Initial evaluation for acute left-sided deficits. EXAM: CT HEAD WITHOUT CONTRAST TECHNIQUE: Contiguous axial images were obtained from the base of the skull through the vertex without intravenous contrast. COMPARISON:  None. FINDINGS: Brain: Cerebral volume within normal limits. No acute intracranial hemorrhage. No acute large vessel territory infarct. No mass lesion, midline shift or mass effect. No hydrocephalus. No extra-axial fluid collection. Vascular: No asymmetric hyperdense vessel. Skull: Scalp soft tissues and calvarium within normal limits. Sinuses/Orbits: Globes and orbital soft tissues normal. Paranasal sinuses and mastoid air cells are clear. Other: None. ASPECTS [facility] Stroke Program Early CT Score) - Ganglionic level infarction (caudate, lentiform nuclei, internal capsule, insula, M1-M3 cortex): 7 - Supraganglionic infarction (M4-M6 cortex): 3 Total score (0-10 with 10 being normal): 10 IMPRESSION: 1. Negative head CT.  No acute intracranial abnormality. 2. ASPECTS  is 10. These results were communicated to [place] at 1:36 amon 5/9/2019by text page via the [facility] messaging system. Electronically Signed   By: Jeannine Boga M.D.   On: 12/18/2017 01:38   CUS  1. RIGHT: There is atherosclerosis with moderate internal carotid artery  stenosis, 40-59%.  2. LEFT: There is mild atherosclerosis with mild internal carotid artery  stenosis, 1-39%  3. Vertebral flow is antegrade and normal bilaterally.  4. Subclavian flow is multiphasic and normal bilaterally.  Signature  Lab Review none    Assessment and Plan:   In summary, Benjamin Blackburn is a 37 y.o. male with PMH of hiatal hernia, stable adrenal mass, colon polyps s/p resection, LE venous insufficiency who presents as a new patient for episode of chest pain and left sided numbness. The left jaw and arm pain as well as left hemiparesthesia are most likely related to the chest pain, however, DDx also including hypertensive emergency as previous considered and TIA. Seizure less likely. Given his hx of LE venous insufficiency, FH of DVT, CUS of right ICA 40-59% stenosis, he will need further work up with CTA head and neck, TCD bubble study, hypercoagulable work up and we will follow up with TTE also. I also spent long time to educate him with relaxation, de-stress and avoid depression since these are also keys for his recovery from his medical condition.   - continue ASA 325mg  and lipitor for stroke prevention - will do CTA head and neck - will do TCD bubble study.  - will draw blood to rule out thickening of the blood - Follow up with your primary care physician for stroke risk factor modification. Recommend maintain blood pressure goal <130/80, diabetes with hemoglobin A1c goal below 7.0% and lipids with LDL cholesterol goal below 70 mg/dL.  - follow up with vascular surgeon, and cardiology - relaxation and de-stress yourself - follow up in 2 months with Janett Billow.   I recommend aggressive blood pressure  control with a goal <130/80 mm Hg.  Lipids should be managed intensively, with a goal LDL < 70 mg/dL.  I encouraged the patient to discuss these important issues with his primary care physician.  I counseled the patient on measures to reduce stroke risk, including the importance of medication compliance, risk factor control, exercise, healthy diet, and avoidance of smoking.  I reviewed  stroke warning signs and symptoms and appropriate actions to take if such occurs.   Thank you very much for the opportunity to participate in the care of this patient.  Please do not hesitate to call if any questions or concerns arise.  Orders Placed This Encounter  Procedures  . CT ANGIO HEAD W OR WO CONTRAST    Standing Status:   Future    Standing Expiration Date:   03/10/2019    Order Specific Question:   If indicated for the ordered procedure, I authorize the administration of contrast media per Radiology protocol    Answer:   Yes    Order Specific Question:   Preferred imaging location?    Answer:   [facility]    Order Specific Question:   Radiology Contrast Protocol - do NOT remove file path    Answer:   \\[email or web address]  . CT ANGIO NECK W OR WO CONTRAST    Standing Status:   Future    Standing Expiration Date:   03/10/2019    Order Specific Question:   If indicated for the ordered procedure, I authorize the administration of contrast media per Radiology protocol    Answer:   Yes    Order Specific Question:   Preferred imaging location?    Answer:   [facility]    Order Specific Question:   Radiology Contrast Protocol - do NOT remove file path    Answer:   \\[email or web address]  . Hypercoagulable panel, comprehensive    No orders of the defined types were placed in this encounter.   Patient Instructions  - continue ASA 325mg  and lipitor for stroke prevention - will do CTA head and neck - will do TCD bubble study.  - will draw blood  to rule out thickening of the blood - Follow up with your primary care physician for stroke risk factor modification. Recommend maintain blood pressure goal <130/80, diabetes with hemoglobin A1c goal below 7.0% and lipids with LDL cholesterol goal below 70 mg/dL.  - follow up with vascular surgeon, and cardiology - relaxation and de-stress yourself - follow up in 2 months with Janett Billow.    Rosalin Hawking, MD PhD [facility] [address], [place], [place] [address] [phone]

## 2018-01-13 ENCOUNTER — Ambulatory Visit (HOSPITAL_COMMUNITY): Payer: 59

## 2018-01-15 ENCOUNTER — Telehealth: Payer: Self-pay

## 2018-01-15 LAB — HYPERCOAGULABLE PANEL, COMPREHENSIVE
ANTICARDIOLIPIN AB, IGM: 20 [MPL'U]
APTT: 28.1 s
AT III ACT/NOR PPP CHRO: 120 %
Act. Prt C Resist w/FV Defic.: 2.5 ratio
DRVVT Screen Seconds: 33.8 s
FACTOR VII ANTIGEN: 151 %
FACTOR VIII ACTIVITY: 61 %
HOMOCYSTEINE: 9.2 umol/L
Hexagonal Phospholipid Neutral: 3 s
PROT C AG ACT/NOR PPP IMM: 117 %
PROT S AG ACT/NOR PPP IMM: 110 %
PROTEIN C AG/FVII AG RATIO: 0.8 ratio
PROTEIN S AG/FVII AG RATIO: 0.7 ratio

## 2018-01-15 NOTE — Telephone Encounter (Signed)
Notes recorded by Marval Regal, RN on 01/15/2018 at 3:53 PM EDT RN call patient that his blood test was normal. Pt verbalized understanding. ------

## 2018-01-15 NOTE — Telephone Encounter (Signed)
-----   Message from Rosalin Hawking, MD sent at 01/15/2018  2:00 PM EDT ----- Could you please let the patient know that the blood test done recently in our office was normal. Please continue current treatment. Thanks.  Rosalin Hawking, MD PhD Stroke Neurology 01/15/2018 2:00 PM

## 2018-01-16 ENCOUNTER — Ambulatory Visit (HOSPITAL_COMMUNITY): Payer: 59 | Attending: Neurology

## 2018-01-30 ENCOUNTER — Ambulatory Visit (HOSPITAL_COMMUNITY): Admission: RE | Admit: 2018-01-30 | Payer: 59 | Source: Ambulatory Visit

## 2018-06-26 ENCOUNTER — Encounter (HOSPITAL_COMMUNITY): Payer: Self-pay

## 2018-06-26 ENCOUNTER — Emergency Department (HOSPITAL_COMMUNITY): Payer: 59

## 2018-06-26 ENCOUNTER — Emergency Department (HOSPITAL_COMMUNITY)
Admission: EM | Admit: 2018-06-26 | Discharge: 2018-06-26 | Disposition: A | Discharge status: Home / Self Care | Payer: 59 | Attending: Emergency Medicine | Admitting: Emergency Medicine

## 2018-06-26 DIAGNOSIS — R0789 Other chest pain: Secondary | ICD-10-CM | POA: Diagnosis not present

## 2018-06-26 DIAGNOSIS — I10 Essential (primary) hypertension: Secondary | ICD-10-CM | POA: Diagnosis not present

## 2018-06-26 DIAGNOSIS — R1310 Dysphagia, unspecified: Secondary | ICD-10-CM | POA: Diagnosis not present

## 2018-06-26 DIAGNOSIS — R1011 Right upper quadrant pain: Secondary | ICD-10-CM | POA: Diagnosis not present

## 2018-06-26 DIAGNOSIS — R079 Chest pain, unspecified: Secondary | ICD-10-CM | POA: Diagnosis present

## 2018-06-26 DIAGNOSIS — Z7982 Long term (current) use of aspirin: Secondary | ICD-10-CM | POA: Diagnosis not present

## 2018-06-26 DIAGNOSIS — Z79899 Other long term (current) drug therapy: Secondary | ICD-10-CM | POA: Insufficient documentation

## 2018-06-26 DIAGNOSIS — Z87891 Personal history of nicotine dependence: Secondary | ICD-10-CM | POA: Diagnosis not present

## 2018-06-26 DIAGNOSIS — R1010 Upper abdominal pain, unspecified: Secondary | ICD-10-CM

## 2018-06-26 LAB — CBC WITH DIFFERENTIAL/PLATELET
Abs Immature Granulocytes: 0.04 10*3/uL (ref 0.00–0.07)
Basophils Absolute: 0 10*3/uL (ref 0.0–0.1)
Basophils Relative: 0 %
Eosinophils Absolute: 0.1 10*3/uL (ref 0.0–0.5)
Eosinophils Relative: 1 %
HCT: 44.6 % (ref 39.0–52.0)
HEMOGLOBIN: 15.4 g/dL (ref 13.0–17.0)
Immature Granulocytes: 0 %
LYMPHS PCT: 30 %
Lymphs Abs: 2.8 10*3/uL (ref 0.7–4.0)
MCH: 30 pg (ref 26.0–34.0)
MCHC: 34.5 g/dL (ref 30.0–36.0)
MCV: 86.9 fL (ref 80.0–100.0)
MONO ABS: 0.6 10*3/uL (ref 0.1–1.0)
MONOS PCT: 6 %
Neutro Abs: 5.9 10*3/uL (ref 1.7–7.7)
Neutrophils Relative %: 63 %
Platelets: 254 10*3/uL (ref 150–400)
RBC: 5.13 MIL/uL (ref 4.22–5.81)
RDW: 11.6 % (ref 11.5–15.5)
WBC: 9.4 10*3/uL (ref 4.0–10.5)
nRBC: 0 % (ref 0.0–0.2)

## 2018-06-26 LAB — COMPREHENSIVE METABOLIC PANEL
ALBUMIN: 4.2 g/dL (ref 3.5–5.0)
ALT: 60 U/L — ABNORMAL HIGH (ref 0–44)
AST: 30 U/L (ref 15–41)
Alkaline Phosphatase: 59 U/L (ref 38–126)
Anion gap: 10 (ref 5–15)
BUN: 7 mg/dL (ref 6–20)
CHLORIDE: 106 mmol/L (ref 98–111)
CO2: 23 mmol/L (ref 22–32)
Calcium: 9.5 mg/dL (ref 8.9–10.3)
Creatinine, Ser: 1.01 mg/dL (ref 0.61–1.24)
GFR calc Af Amer: >60 mL/min (ref >60)
GFR calc non Af Amer: >60 mL/min (ref >60)
GLUCOSE: 117 mg/dL — AB (ref 70–99)
POTASSIUM: 3.4 mmol/L — AB (ref 3.5–5.1)
Sodium: 139 mmol/L (ref 135–145)
Total Bilirubin: 0.7 mg/dL (ref 0.3–1.2)
Total Protein: 7.8 g/dL (ref 6.5–8.1)

## 2018-06-26 LAB — LIPASE, BLOOD: LIPASE: 29 U/L (ref 11–51)

## 2018-06-26 LAB — I-STAT TROPONIN, ED: Troponin i, poc: 0 ng/mL (ref 0.00–0.08)

## 2018-06-26 LAB — PROTIME-INR
INR: 1.02
Prothrombin Time: 13.3 seconds (ref 11.4–15.2)

## 2018-06-26 NOTE — ED Provider Notes (Signed)
[facility] EMERGENCY DEPARTMENT Provider Note   CSN: [id number] Arrival date & time: 06/26/18  1506     History   Chief Complaint No chief complaint on file.   HPI Benjamin Blackburn is a 37 y.o. male.  37yo M w/ PMH including DVT on eliquis, IBS, PUD, HTN, adrenal mass, hiatal hernia who p/w abdominal pain and chest pain.  Patient has been having progressively worsening problems with upper abdominal pain for over the past 1 month.  He has had multiple work-ups including a right upper quadrant ultrasound and CT of abdomen in October.  He saw gastroenterology and has an EGD scheduled on 11/25.  He is currently on omeprazole and Carafate but these have not improved his symptoms.  He states that he has had decreased appetite and has not been eating much because of dysphasia.  He reports that since having these issues with abdominal pain, he has also had some central associated chest pain.  He describes it as a pressure and it typically occurs when his abdominal pain is worse.  The chest discomfort has been going on for at least 1 month.  He notes that today he began sweating at work and was having more severe abdominal pain so his coworkers sent him to the ED.  He states that for the past month he has been going to work then coming home and going to sleep.  He states "I do not feel like myself."  No fevers or vomiting, endorses mild nausea.  He has chronic nonbloody diarrhea related to IBS.  Of note, he had an vascular thermal ablation of his right leg saphenous vein 1 week ago and is scheduled to have the same procedure on his left leg in December.  He takes Eliquis daily and reports compliance with the medication.  Since the procedure, he states that he has felt that his left leg is weaker but is not sure whether it is because he has been compensating due to right leg pain.  He reports some low back pain over the past 3 days.  No bowel/bladder incontinence, saddle anesthesia, or  problems walking.  He denies any alcohol, NSAID, or drug use.  The history is provided by the patient.    Past Medical History:  Diagnosis Date  . Adrenal abnormality (HCC)    mass on adrenal gland  . 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 E coli infection 03/2016   completed treatment  . Hiatal hernia   . Hypertension   . Lung mass   . Multiple gastric ulcers     Patient Active Problem List   Diagnosis Date Noted  . Hemiparesthesia 01/08/2018  . TIA (transient ischemic attack) 01/08/2018  . Leg swelling   . Pain in the chest 05/08/2016    Past Surgical History:  Procedure Laterality Date  . APPENDECTOMY    . CARDIAC CATHETERIZATION N/A 05/09/2016   Procedure: Left Heart Cath and Coronary Angiography;  Surgeon: Jettie Booze, MD;  Location: [facility];  Service: Cardiovascular;  Laterality: N/A;  . NASAL SEPTUM SURGERY    . TONSILLECTOMY          Home Medications    Prior to Admission medications   Medication Sig Start Date End Date Taking? Authorizing Provider  aspirin 325 MG tablet Take 325 mg by mouth daily.    [provider]  atorvastatin (LIPITOR) 20 MG tablet Take 20 mg by mouth daily.  [provider]  calcium carbonate (TUMS - DOSED IN MG ELEMENTAL CALCIUM) 500 MG chewable tablet Chew 1-2 tablets by mouth daily as needed for indigestion.    [provider]  calcium carbonate (TUMS - DOSED IN MG ELEMENTAL CALCIUM) 500 MG chewable tablet Chew by mouth.    [provider]  ibuprofen (ADVIL,MOTRIN) 200 MG tablet Take 200-800 mg by mouth every 6 (six) hours as needed for moderate pain.    [provider]  NONFORMULARY OR COMPOUNDED ITEM Apply 1 application topically as needed. For yeast infection     [provider]  ondansetron (ZOFRAN ODT) 4 MG disintegrating tablet Take 1 tablet (4 mg total) by mouth every 8 (eight) hours as needed for nausea or vomiting. 10/28/16   Horton, Barbette Hair, MD      Family History History reviewed. No pertinent family history.  Social History Social History   Tobacco Use  . Smoking status: Former Smoker    Last attempt to quit: 11/29/2015    Years since quitting: 2.5  . Smokeless tobacco: Never Used  Substance Use Topics  . Alcohol use: No  . Drug use: No     Allergies   Amoxicillin; Latex; Penicillins; Tape; and Tetracyclines & related   Review of Systems Review of Systems All other systems reviewed and are negative except that which was mentioned in HPI   Physical Exam Updated Vital Signs BP (!) 147/90 (BP Location: Right Arm)   Pulse 68   Temp (!) 97.5 F (36.4 C) (Oral)   Resp 18   Ht 6\' 3"  (1.905 m)   Wt 116.6 kg   SpO2 100%   BMI 32.12 kg/m   Physical Exam  Constitutional: He is oriented to person, place, and time. He appears well-developed and well-nourished. No distress.  HENT:  Head: Normocephalic and atraumatic.  Moist mucous membranes  Eyes: Pupils are equal, round, and reactive to light. Conjunctivae are normal.  Neck: Neck supple.  Cardiovascular: Normal rate, regular rhythm, normal heart sounds and intact distal pulses.  No murmur heard. Pulmonary/Chest: Effort normal and breath sounds normal.  Abdominal: Soft. Bowel sounds are normal. He exhibits no distension. There is tenderness in the right upper quadrant, epigastric area and left upper quadrant. There is no rebound and no guarding.  Musculoskeletal: He exhibits no tenderness.  Compression stocking R leg  Neurological: He is alert and oriented to person, place, and time. No sensory deficit.  Fluent speech 5/5 strength BLE  Skin: Skin is warm and dry.  Psychiatric: He has a normal mood and affect. Judgment normal.  Nursing note and vitals reviewed.    ED Treatments / Results  Labs (all labs ordered are listed, but only abnormal results are displayed) Labs Reviewed  COMPREHENSIVE METABOLIC PANEL - Abnormal; Notable for the following  components:      Result Value   Potassium 3.4 (*)    Glucose, Bld 117 (*)    ALT 60 (*)    All other components within normal limits  PROTIME-INR  CBC WITH DIFFERENTIAL/PLATELET  LIPASE, BLOOD  I-STAT TROPONIN, ED    EKG EKG Interpretation  Date/Time:  Friday June 26 2018 15:19:21 EST Ventricular Rate:  87 PR Interval:    QRS Duration: 97 QT Interval:  375 QTC Calculation: 452 R Axis:   47 Text Interpretation:  Sinus rhythm Anterior infarct, old Minimal ST elevation, lateral leads rate faster but otherwise similar to previous Confirmed by Theotis Burrow [phone]) on 06/26/2018 3:41:14 PM  Radiology Dg Chest 2 View  Result Date: 06/26/2018 CLINICAL DATA:  Chest pain and severe upper abdominal pain. EXAM: CHEST - 2 VIEW COMPARISON:  Chest radiographs and CTA 05/08/2016 FINDINGS: The cardiomediastinal silhouette is unchanged with normal heart size. The lungs are mildly hypoinflated without evidence of airspace consolidation, edema, pleural effusion, pneumothorax. No acute osseous abnormality is seen. IMPRESSION: No active cardiopulmonary disease. Electronically Signed   By: Logan Bores M.D.   On: 06/26/2018 15:59    Procedures Procedures (including critical care time)  Medications Ordered in ED Medications - No data to display   Initial Impression / Assessment and Plan / ED Course  I have reviewed the triage vital signs and the nursing notes.  Pertinent labs & imaging results that were available during my care of the patient were reviewed by me and considered in my medical decision making (see chart for details).    Well-appearing on exam, reassuring vital signs.  EKG without acute ischemic changes.  Chest x-ray normal.  He did have some upper abdominal tenderness on exam similar to previous examinations.  He notes that this chest discomfort has been going on for at least a month and sounds like it is associated with his abdominal pain exacerbations.  His work-up here  has been reassuring including negative troponin, negative lipase, reassuring CMP and CBC.  His description is very atypical for ACS.  He has an EGD scheduled soon and I have instructed him to contact his gastroenterologist for follow-up appointment.  Extensively reviewed return precautions and he voiced understanding. Final Clinical Impressions(s) / ED Diagnoses   Final diagnoses:  Atypical chest pain  Upper abdominal pain    ED Discharge Orders    None       , Wenda Overland, MD 06/27/18 [phone]

## 2018-06-26 NOTE — ED Triage Notes (Addendum)
Pt. Coming from home reporting chest pain with severe upper abdominal pain. Pt. Had recent endovenous thermal ablation to right leg about a week ago. Bilateral pulses with skin warm and dry. Pt. Reports since the procedure he feels like his left leg feels weaker. Pt. Is on elequis twice a day.  Pt. A/o x4 NAD.

## 2020-06-08 IMAGING — DX DG CHEST 2V
2 series · 2 of 2 positions shown · non-contrast
Comparison: Chest radiographs and CTA 05/08/2016

CLINICAL DATA: Chest pain and severe upper abdominal pain.

EXAM:
CHEST - 2 VIEW

[chest lat]
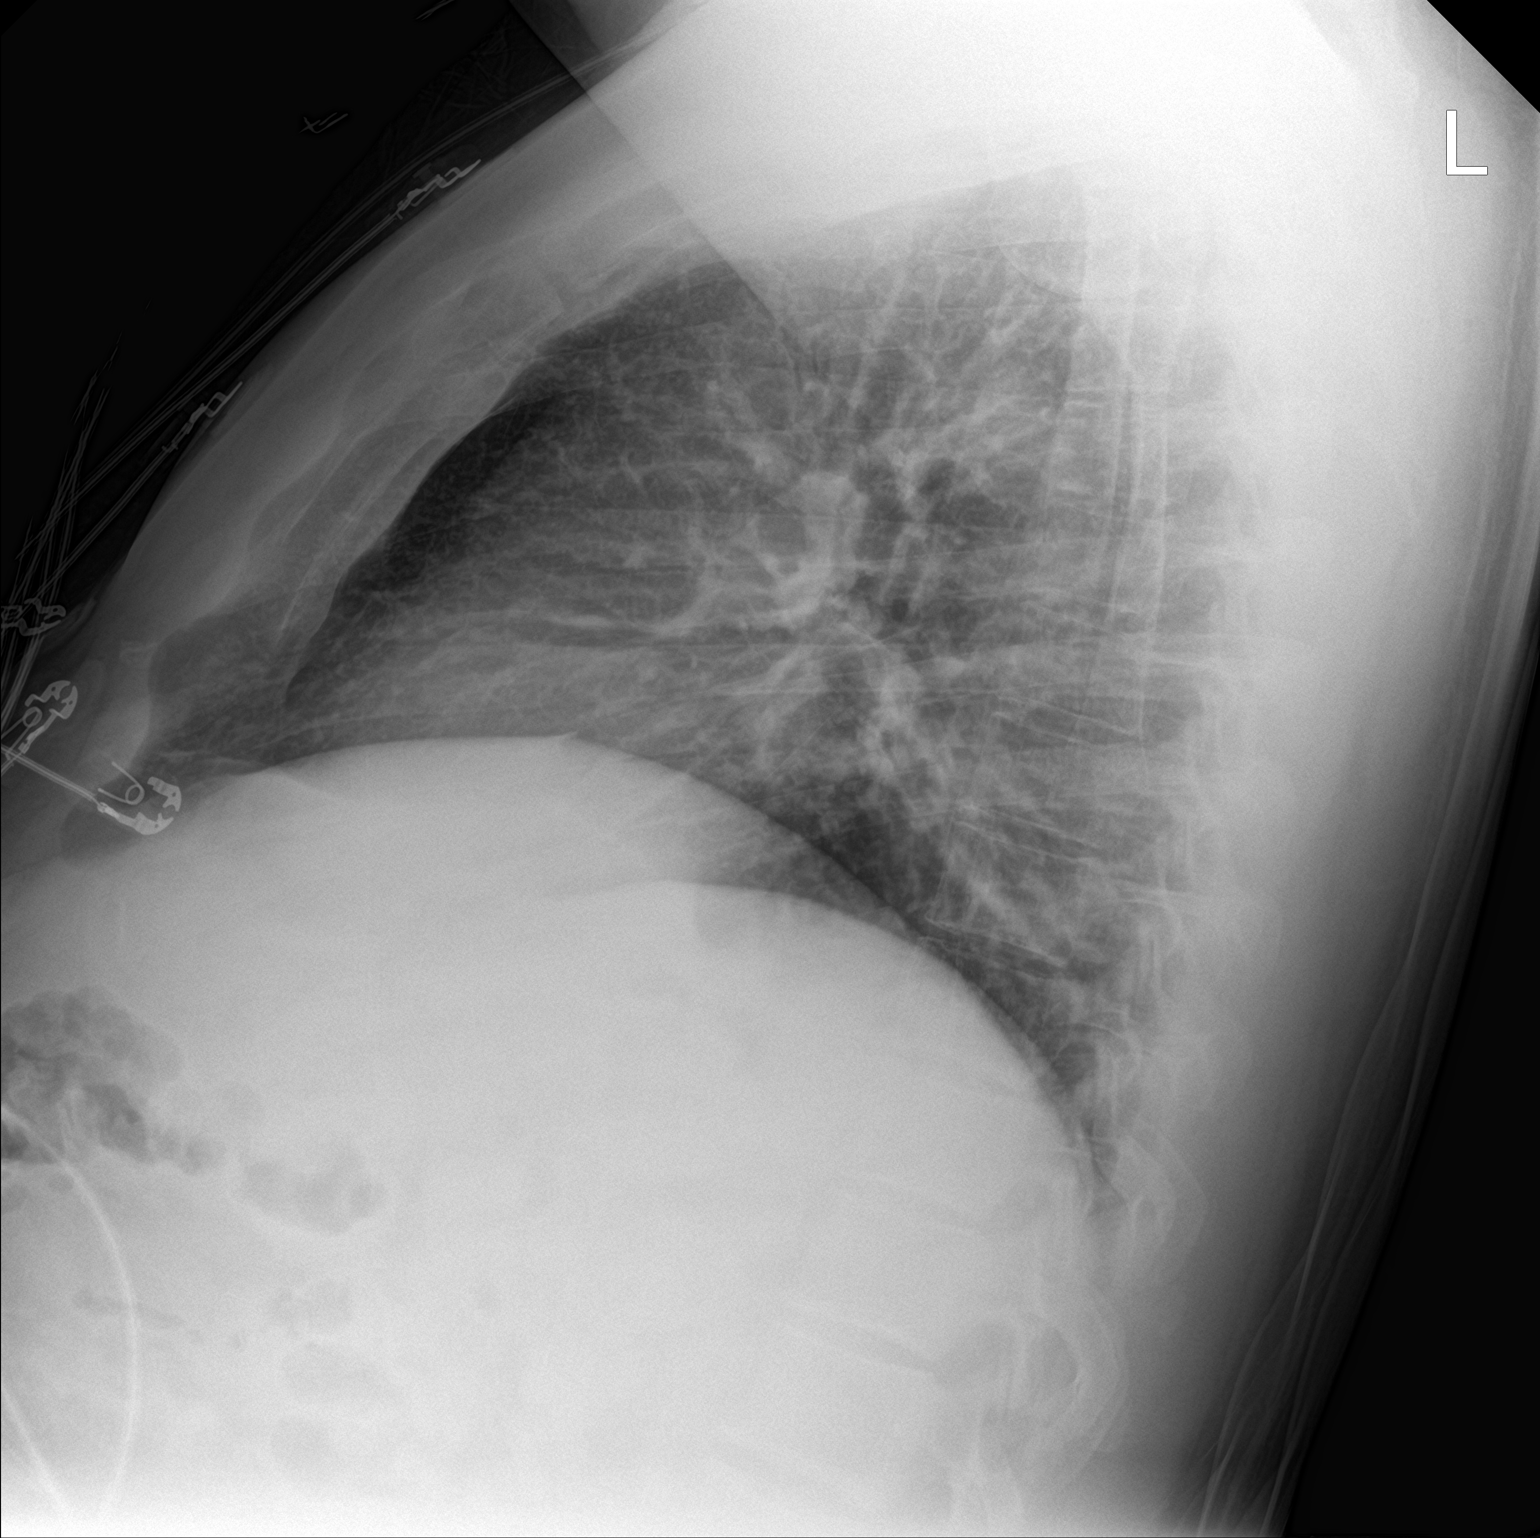

[chest ap]
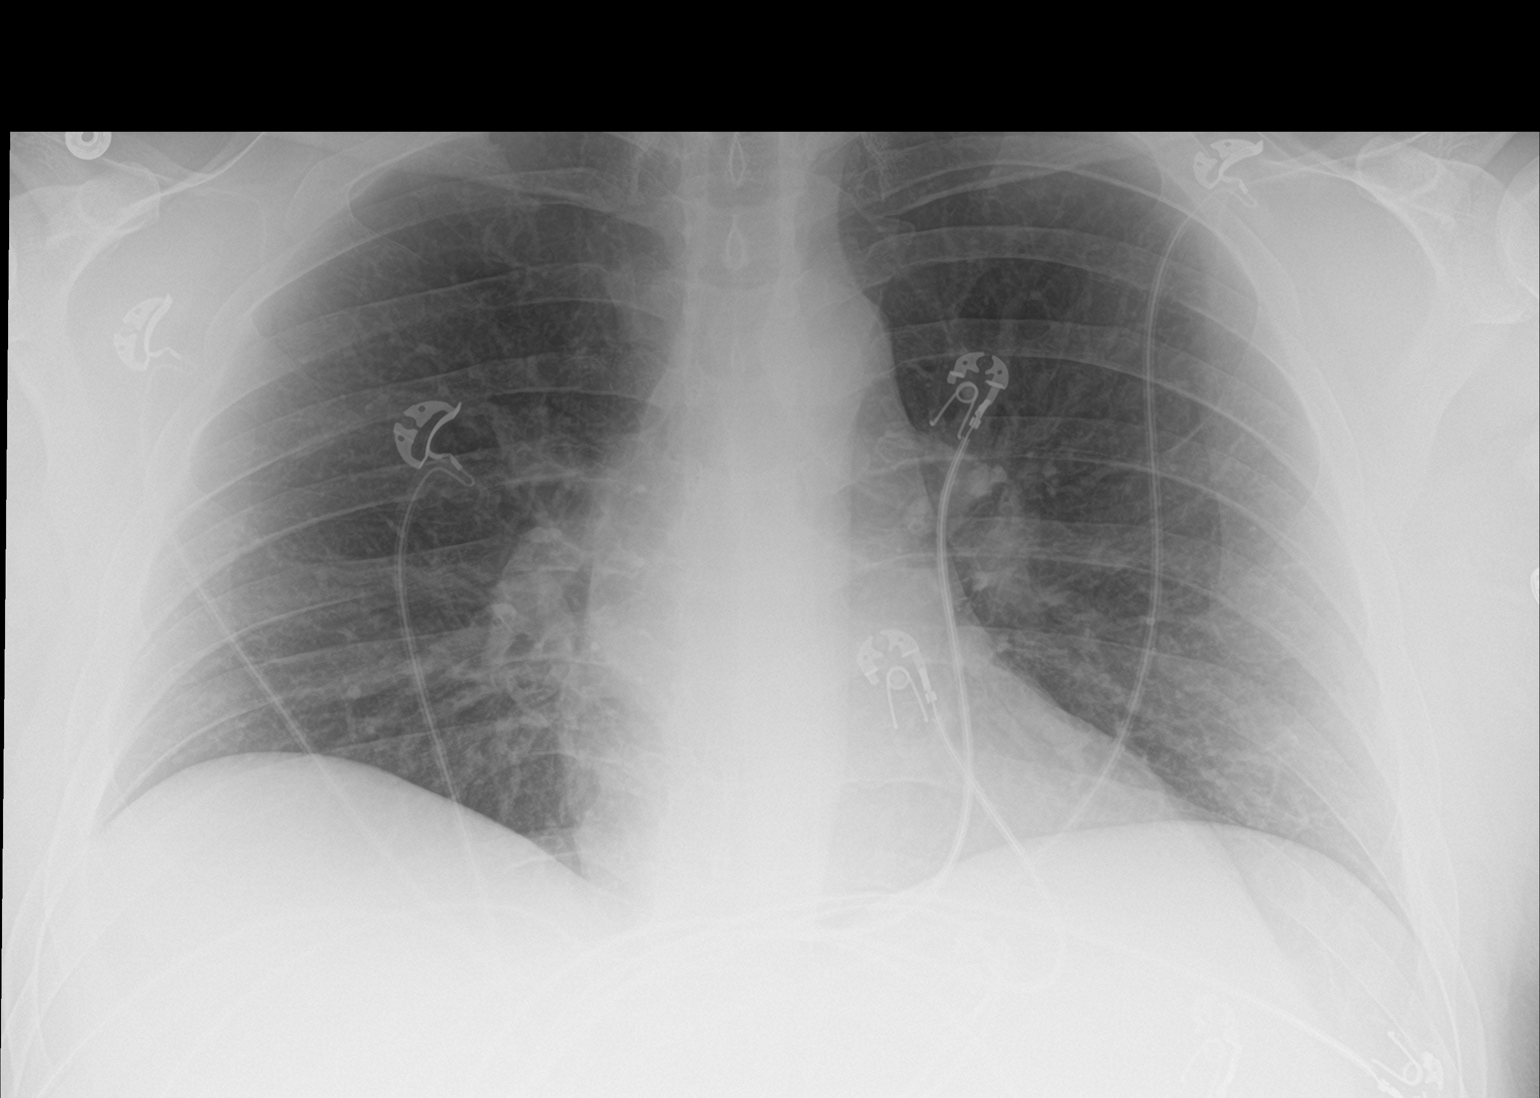

[2 of 2 positions shown; findings below may reference images not displayed]

FINDINGS: The cardiomediastinal silhouette is unchanged with normal heart
size. The lungs are mildly hypoinflated without evidence of airspace
consolidation, edema, pleural effusion, pneumothorax. No acute
osseous abnormality is seen.
IMPRESSION: No active cardiopulmonary disease.
